# Patient Record
Sex: Female | Born: 1990 | Hispanic: Yes | Marital: Married | Smoking: Never smoker
Health system: Southern US, Community
[De-identification: ages and names within clinical notes are randomized; demographics above are authoritative.]

## PROBLEM LIST (undated history)

## (undated) DIAGNOSIS — K831 Obstruction of bile duct: Secondary | ICD-10-CM

## (undated) DIAGNOSIS — O26649 Intrahepatic cholestasis of pregnancy, unspecified trimester: Secondary | ICD-10-CM

## (undated) DIAGNOSIS — R053 Chronic cough: Secondary | ICD-10-CM

## (undated) DIAGNOSIS — O26619 Liver and biliary tract disorders in pregnancy, unspecified trimester: Secondary | ICD-10-CM

## (undated) DIAGNOSIS — O24419 Gestational diabetes mellitus in pregnancy, unspecified control: Secondary | ICD-10-CM

## (undated) HISTORY — DX: Intrahepatic cholestasis of pregnancy, unspecified trimester: O26.649

## (undated) HISTORY — PX: NO PAST SURGERIES: SHX2092

## (undated) HISTORY — DX: Obstruction of bile duct: K83.1

## (undated) HISTORY — DX: Liver and biliary tract disorders in pregnancy, unspecified trimester: O26.619

## (undated) HISTORY — DX: Chronic cough: R05.3

## (undated) HISTORY — DX: Gestational diabetes mellitus in pregnancy, unspecified control: O24.419

---

## 2016-10-25 ENCOUNTER — Encounter (HOSPITAL_COMMUNITY): Payer: Self-pay

## 2016-10-25 ENCOUNTER — Emergency Department (HOSPITAL_COMMUNITY)
Admission: EM | Admit: 2016-10-25 | Discharge: 2016-10-25 | Disposition: A | Discharge status: Home / Self Care | Payer: Medicaid Other | Attending: Emergency Medicine | Admitting: Emergency Medicine

## 2016-10-25 ENCOUNTER — Emergency Department (HOSPITAL_COMMUNITY): Payer: Medicaid Other

## 2016-10-25 DIAGNOSIS — O23592 Infection of other part of genital tract in pregnancy, second trimester: Secondary | ICD-10-CM | POA: Insufficient documentation

## 2016-10-25 DIAGNOSIS — R109 Unspecified abdominal pain: Secondary | ICD-10-CM

## 2016-10-25 DIAGNOSIS — N760 Acute vaginitis: Secondary | ICD-10-CM

## 2016-10-25 DIAGNOSIS — B9689 Other specified bacterial agents as the cause of diseases classified elsewhere: Secondary | ICD-10-CM | POA: Diagnosis not present

## 2016-10-25 DIAGNOSIS — R1084 Generalized abdominal pain: Secondary | ICD-10-CM | POA: Diagnosis not present

## 2016-10-25 DIAGNOSIS — O26892 Other specified pregnancy related conditions, second trimester: Secondary | ICD-10-CM | POA: Diagnosis present

## 2016-10-25 DIAGNOSIS — Z3A15 15 weeks gestation of pregnancy: Secondary | ICD-10-CM

## 2016-10-25 LAB — I-STAT BETA HCG BLOOD, ED (MC, WL, AP ONLY): I-stat hCG, quantitative: >2000 m[IU]/mL — ABNORMAL HIGH (ref <5)

## 2016-10-25 LAB — I-STAT CHEM 8, ED
BUN: <3 mg/dL — ABNORMAL LOW (ref 6–20)
CREATININE: 0.5 mg/dL (ref 0.44–1.00)
Calcium, Ion: 1.14 mmol/L — ABNORMAL LOW (ref 1.15–1.40)
Chloride: 105 mmol/L (ref 101–111)
GLUCOSE: 80 mg/dL (ref 65–99)
HCT: 34 % — ABNORMAL LOW (ref 36.0–46.0)
HEMOGLOBIN: 11.6 g/dL — AB (ref 12.0–15.0)
POTASSIUM: 3.5 mmol/L (ref 3.5–5.1)
Sodium: 139 mmol/L (ref 135–145)
TCO2: 23 mmol/L (ref 0–100)

## 2016-10-25 LAB — WET PREP, GENITAL
SPERM: NONE SEEN
Trich, Wet Prep: NONE SEEN
Yeast Wet Prep HPF POC: NONE SEEN

## 2016-10-25 MED ORDER — METRONIDAZOLE 500 MG PO TABS: 500.0000 mg | ORAL_TABLET | Freq: Two times a day (BID) | ORAL | 0 refills | Status: DC

## 2016-10-25 NOTE — ED Notes (Signed)
On way to US 

## 2016-10-25 NOTE — ED Provider Notes (Signed)
[facility]-EM[facility]ENCY DEPT Provider Note   CSN: [id number] Arrival date & time: 10/25/16  1103     History   Chief Complaint Chief Complaint  Patient presents with  . Abdominal Pain    HPI Wendy Moses is a 26 y.o. female.  She presents for evaluation of suprapubic pain associated with generalized abdominal pain. Onset of pain 2 days ago. She thinks she is [redacted] weeks pregnant, so based on last menstrual period 07/13/2016. She denies fever, chills, nausea, vomiting, vaginal bleeding or vaginal discharge. She has not had prenatal care yet. This is her second pregnancy. There are no other known modifying factors.  HPI  History reviewed. No pertinent past medical history.  There are no active problems to display for this patient.   History reviewed. No pertinent surgical history.  OB History    Gravida Para Term Preterm AB Living   1             SAB TAB Ectopic Multiple Live Births                   Home Medications    Prior to Admission medications   Medication Sig Start Date End Date Taking? Authorizing Provider  Prenatal Vit-Fe Fumarate-FA (MULTIVITAMIN-PRENATAL) 27-0.8 MG TABS tablet Take 1 tablet by mouth daily at 12 noon.   Yes Historical Provider, MD  metroNIDAZOLE (FLAGYL) 500 MG tablet Take 1 tablet (500 mg total) by mouth 2 (two) times daily. 10/25/16   Mancel Bale, MD    Family History History reviewed. No pertinent family history.  Social History Social History  Substance Use Topics  . Smoking status: Never Smoker  . Smokeless tobacco: Never Used  . Alcohol use Not on file     Allergies   Patient has no known allergies.   Review of Systems Review of Systems  All other systems reviewed and are negative.    Physical Exam Updated Vital Signs BP 110/71   Pulse 82   Temp 98.8 F (37.1 C) (Oral)   Resp 18   Ht 5\' 4"  (1.626 m)   Wt 170 lb (77.1 kg)   LMP 07/13/2016 (Exact Date)   SpO2 97%   BMI 29.18 kg/m   Physical Exam  Constitutional:  She is oriented to person, place, and time. She appears well-developed and well-nourished. No distress.  HENT:  Head: Normocephalic and atraumatic.  Eyes: Conjunctivae and EOM are normal. Pupils are equal, round, and reactive to light.  Neck: Normal range of motion and phonation normal. Neck supple.  Cardiovascular: Normal rate and regular rhythm.   Pulmonary/Chest: Effort normal and breath sounds normal. She exhibits no tenderness.  Abdominal: Soft. She exhibits no distension. There is no tenderness. There is no guarding.  Genitourinary:  Genitourinary Comments: Normal external female genitalia. Thick green discharge in the vaginal vault. No cervical bleeding. Her cervical dilation. On bimanual examination the uterus is palpable, 10-12 weeks size. No significant uterine tenderness.  Musculoskeletal: Normal range of motion.  Neurological: She is alert and oriented to person, place, and time. She exhibits normal muscle tone.  Skin: Skin is warm and dry.  Psychiatric: She has a normal mood and affect. Her behavior is normal. Judgment and thought content normal.  Nursing note and vitals reviewed.    ED Treatments / Results  Labs (all labs ordered are listed, but only abnormal results are displayed) Labs Reviewed  WET PREP, GENITAL - Abnormal; Notable for the following:       Result Value  Clue Cells Wet Prep HPF POC PRESENT (*)    WBC, Wet Prep HPF POC MANY (*)    All other components within normal limits  I-STAT CHEM 8, ED - Abnormal; Notable for the following:    BUN <3 (*)    Calcium, Ion 1.14 (*)    Hemoglobin 11.6 (*)    HCT 34.0 (*)    All other components within normal limits  I-STAT BETA HCG BLOOD, ED ([facility], [facility], [facility] ONLY) - Abnormal; Notable for the following:    I-stat hCG, quantitative >2,000.0 (*)    All other components within normal limits  RPR  HIV ANTIBODY (ROUTINE TESTING)  GC/CHLAMYDIA PROBE AMP (Angelica) NOT AT [facility]    EKG  EKG Interpretation None        Radiology US Ob Limited  Result Date: 10/25/2016 CLINICAL DATA:  Abdominal pain.  Pregnancy. EXAM: LIMITED OBSTETRIC ULTRASOUND FINDINGS: Number of Fetuses: 1 Heart Rate:  143 bpm Movement: Present Presentation: Transverse Placental Location: Fundal Previa: No Amniotic Fluid (Subjective):  Within normal limits. BPD:  3.0cm 15w  4d MATERNAL FINDINGS: Cervix:  Appears closed. Uterus/Adnexae: No abnormality visualized. Left ovary not visualized. IMPRESSION: Single viable intrauterine pregnancy at 15 weeks 4 days. This exam is performed on an emergent basis and does not comprehensively evaluate fetal size, dating, or anatomy; follow-up complete OB US should be considered if further fetal assessment is warranted. Electronically Signed   By: Maisie Fus  Register   On: 10/25/2016 15:30    Procedures Procedures (including critical care time)  Medications Ordered in ED Medications - No data to display   Initial Impression / Assessment and Plan / ED Course  I have reviewed the triage vital signs and the nursing notes.  Pertinent labs & imaging results that were available during my care of the patient were reviewed by me and considered in my medical decision making (see chart for details).     Medications - No data to display  Patient Vitals for the past 24 hrs:  BP Temp Temp src Pulse Resp SpO2 Height Weight  10/25/16 1245 110/71 - - 82 - 97 % - -  10/25/16 1200 102/69 - - 80 - 97 % - -  10/25/16 1113 - - - - - - 5\' 4"  (1.626 m) 170 lb (77.1 kg)  10/25/16 1111 106/77 98.8 F (37.1 C) Oral 93 18 96 % - -    3:40 PM Reevaluation with update and discussion. After initial assessment and treatment, an updated evaluation reveals she is comfortable.  She has no further complaints.  Findings discussed with the patient and all questions were answered. Ellouise Mcwhirter L    Final Clinical Impressions(s) / ED Diagnoses   Final diagnoses:  [redacted] weeks gestation of pregnancy  Acute vaginitis     Nonspecific abdominal pain in the early second trimester pregnancy.  Reassuring ultrasound today.  Vaginal discharge with signs for nonspecific vaginitis.  Doubt serious bacterial infection acute pregnancy complication or metabolic instability.  Nursing Notes Reviewed/ Care Coordinated Applicable Imaging Reviewed Interpretation of Laboratory Data incorporated into ED treatment  The patient appears reasonably screened and/or stabilized for discharge and I doubt any other medical condition or other [facility] requiring further screening, evaluation, or treatment in the ED at this time prior to discharge.  Plan: Home Medications-continue usual; Home Treatments-rest, fluids F-2; return here if the recommended treatment, does not improve the symptoms; Recommended follow up-OB follow-up as soon as possible   New Prescriptions New Prescriptions   METRONIDAZOLE (  FLAGYL) 500 MG TABLET    Take 1 tablet (500 mg total) by mouth 2 (two) times daily.     Mancel BaleElliott Filmore Molyneux, MD 10/25/16 [id number]

## 2016-10-25 NOTE — ED Triage Notes (Signed)
Pt presents with vaginal pressure that began yesterday.  Pt reports pressure begins in vagina and radiates up into epigastric area.  Pt denies any vaginal discharge/bleeding or dysuria, pt denies any nausea, vomiting.

## 2016-10-25 NOTE — Discharge Instructions (Signed)
Get plenty of rest and drink a lot of fluids.  Follow up with your obstetric doctor as soon as possible.  It is safe to take Tylenol, for pain.

## 2016-10-26 LAB — GC/CHLAMYDIA PROBE AMP (~~LOC~~) NOT AT ARMC
CHLAMYDIA, DNA PROBE: NEGATIVE
NEISSERIA GONORRHEA: NEGATIVE

## 2016-10-26 LAB — RPR: RPR Ser Ql: NONREACTIVE

## 2016-10-26 LAB — HIV ANTIBODY (ROUTINE TESTING W REFLEX): HIV SCREEN 4TH GENERATION: NONREACTIVE

## 2017-09-10 NOTE — L&D Delivery Note (Signed)
Delivery Note  At  1810 a viable female was delivered via  (Presentation: ; LOA ).  APGAR: , ;pending weight  .  pending Placenta status: sent to pathology due to cholestasis, .  Cord:3 vessels  with the following complications: Heavy bleeding cytotec 800mcg given per rectum. Bleeding stabilized  Anesthesia:  Epidural Episiotomy:   Lacerations:  first degree lac Suture Repair: 3.0 vicryl Est. Blood Loss (mL):  450cc  Mom to postpartum.  Baby to Couplet care / Skin to Skin.  Bristol Osentoski A Lillyonna Armstead CNM 04/20/2018, 6:32 PM

## 2018-04-09 ENCOUNTER — Telehealth (HOSPITAL_COMMUNITY): Payer: Self-pay | Admitting: *Deleted

## 2018-04-09 ENCOUNTER — Encounter (HOSPITAL_COMMUNITY): Payer: Self-pay | Admitting: *Deleted

## 2018-04-09 NOTE — Telephone Encounter (Signed)
Preadmission screen INTERPRETER NUMBER [phone]

## 2018-04-16 ENCOUNTER — Other Ambulatory Visit: Payer: Self-pay | Admitting: Obstetrics and Gynecology

## 2018-04-20 ENCOUNTER — Inpatient Hospital Stay (HOSPITAL_COMMUNITY): Payer: Medicaid Other | Admitting: Anesthesiology

## 2018-04-20 ENCOUNTER — Inpatient Hospital Stay (HOSPITAL_COMMUNITY)
Admission: RE | Admit: 2018-04-20 | Discharge: 2018-04-22 | DRG: 805 | Disposition: A | Discharge status: Home / Self Care | Payer: Medicaid Other | Attending: Obstetrics and Gynecology | Admitting: Obstetrics and Gynecology

## 2018-04-20 ENCOUNTER — Encounter (HOSPITAL_COMMUNITY): Payer: Self-pay

## 2018-04-20 DIAGNOSIS — F419 Anxiety disorder, unspecified: Secondary | ICD-10-CM | POA: Diagnosis present

## 2018-04-20 DIAGNOSIS — O26649 Intrahepatic cholestasis of pregnancy, unspecified trimester: Secondary | ICD-10-CM | POA: Diagnosis present

## 2018-04-20 DIAGNOSIS — R05 Cough: Secondary | ICD-10-CM | POA: Diagnosis present

## 2018-04-20 DIAGNOSIS — O9902 Anemia complicating childbirth: Secondary | ICD-10-CM | POA: Diagnosis present

## 2018-04-20 DIAGNOSIS — O99344 Other mental disorders complicating childbirth: Secondary | ICD-10-CM | POA: Diagnosis present

## 2018-04-20 DIAGNOSIS — K831 Obstruction of bile duct: Secondary | ICD-10-CM | POA: Diagnosis present

## 2018-04-20 DIAGNOSIS — O99214 Obesity complicating childbirth: Secondary | ICD-10-CM | POA: Diagnosis present

## 2018-04-20 DIAGNOSIS — O26619 Liver and biliary tract disorders in pregnancy, unspecified trimester: Secondary | ICD-10-CM

## 2018-04-20 DIAGNOSIS — Z3490 Encounter for supervision of normal pregnancy, unspecified, unspecified trimester: Secondary | ICD-10-CM | POA: Diagnosis present

## 2018-04-20 DIAGNOSIS — Z3A37 37 weeks gestation of pregnancy: Secondary | ICD-10-CM

## 2018-04-20 DIAGNOSIS — O864 Pyrexia of unknown origin following delivery: Secondary | ICD-10-CM | POA: Diagnosis not present

## 2018-04-20 DIAGNOSIS — O99019 Anemia complicating pregnancy, unspecified trimester: Secondary | ICD-10-CM | POA: Diagnosis present

## 2018-04-20 DIAGNOSIS — O2662 Liver and biliary tract disorders in childbirth: Principal | ICD-10-CM | POA: Diagnosis present

## 2018-04-20 DIAGNOSIS — O9089 Other complications of the puerperium, not elsewhere classified: Secondary | ICD-10-CM | POA: Diagnosis present

## 2018-04-20 DIAGNOSIS — E669 Obesity, unspecified: Secondary | ICD-10-CM | POA: Diagnosis present

## 2018-04-20 DIAGNOSIS — D649 Anemia, unspecified: Secondary | ICD-10-CM | POA: Diagnosis present

## 2018-04-20 HISTORY — DX: Intrahepatic cholestasis of pregnancy, unspecified trimester: O26.649

## 2018-04-20 HISTORY — DX: Encounter for supervision of normal pregnancy, unspecified, unspecified trimester: Z34.90

## 2018-04-20 LAB — RPR: RPR Ser Ql: NONREACTIVE

## 2018-04-20 LAB — HEPATIC FUNCTION PANEL
ALT: 19 U/L (ref 0–44)
AST: 21 U/L (ref 15–41)
Albumin: 2.5 g/dL — ABNORMAL LOW (ref 3.5–5.0)
Alkaline Phosphatase: 274 U/L — ABNORMAL HIGH (ref 38–126)
TOTAL PROTEIN: 6.9 g/dL (ref 6.5–8.1)
Total Bilirubin: 0.6 mg/dL (ref 0.3–1.2)

## 2018-04-20 LAB — TYPE AND SCREEN
ABO/RH(D): A POS
ANTIBODY SCREEN: NEGATIVE

## 2018-04-20 LAB — CBC
HEMATOCRIT: 29 % — AB (ref 36.0–46.0)
HEMOGLOBIN: 9 g/dL — AB (ref 12.0–15.0)
MCH: 22 pg — ABNORMAL LOW (ref 26.0–34.0)
MCHC: 31 g/dL (ref 30.0–36.0)
MCV: 70.7 fL — ABNORMAL LOW (ref 78.0–100.0)
Platelets: 417 10*3/uL — ABNORMAL HIGH (ref 150–400)
RBC: 4.1 MIL/uL (ref 3.87–5.11)
RDW: 17.7 % — ABNORMAL HIGH (ref 11.5–15.5)
WBC: 8.8 10*3/uL (ref 4.0–10.5)

## 2018-04-20 LAB — ABO/RH: ABO/RH(D): A POS

## 2018-04-20 MED ORDER — LACTATED RINGERS IV SOLN
500.0000 mL | Freq: Once | INTRAVENOUS | Status: AC
  Administered 2018-04-20: 500 mL via INTRAVENOUS

## 2018-04-20 MED ORDER — FENTANYL CITRATE (PF) 100 MCG/2ML IJ SOLN: 100.0000 ug | INTRAMUSCULAR | Status: DC | PRN

## 2018-04-20 MED ORDER — TERBUTALINE SULFATE 1 MG/ML IJ SOLN: 0.2500 mg | Freq: Once | INTRAMUSCULAR | Status: DC | PRN

## 2018-04-20 MED ORDER — OXYCODONE-ACETAMINOPHEN 5-325 MG PO TABS
1.0000 | ORAL_TABLET | ORAL | Status: DC | PRN
Start: 2018-04-20 — End: 2018-04-22

## 2018-04-20 MED ORDER — SOD CITRATE-CITRIC ACID 500-334 MG/5ML PO SOLN: 30.0000 mL | ORAL | Status: DC | PRN

## 2018-04-20 MED ORDER — ACETAMINOPHEN 325 MG PO TABS
650.0000 mg | ORAL_TABLET | ORAL | Status: DC | PRN
  Administered 2018-04-20: 650 mg via ORAL
  Filled 2018-04-20: qty 2

## 2018-04-20 MED ORDER — FENTANYL 2.5 MCG/ML BUPIVACAINE 1/10 % EPIDURAL INFUSION (WH - ANES)
14.0000 mL/h | INTRAMUSCULAR | Status: DC | PRN
  Administered 2018-04-20: 14 mL/h via EPIDURAL
  Filled 2018-04-20: qty 100

## 2018-04-20 MED ORDER — COCONUT OIL OIL: 1.0000 "application " | TOPICAL_OIL | Status: DC | PRN

## 2018-04-20 MED ORDER — DIPHENHYDRAMINE HCL 25 MG PO CAPS: 25.0000 mg | ORAL_CAPSULE | Freq: Four times a day (QID) | ORAL | Status: DC | PRN

## 2018-04-20 MED ORDER — DIBUCAINE 1 % RE OINT: 1.0000 "application " | TOPICAL_OINTMENT | RECTAL | Status: DC | PRN

## 2018-04-20 MED ORDER — LACTATED RINGERS IV SOLN
INTRAVENOUS | Status: DC
  Administered 2018-04-20 (×2): via INTRAVENOUS

## 2018-04-20 MED ORDER — BENZOCAINE-MENTHOL 20-0.5 % EX AERO
1.0000 "application " | INHALATION_SPRAY | CUTANEOUS | Status: DC | PRN
  Administered 2018-04-20: 1 via TOPICAL
  Filled 2018-04-20: qty 56

## 2018-04-20 MED ORDER — SENNOSIDES-DOCUSATE SODIUM 8.6-50 MG PO TABS
2.0000 | ORAL_TABLET | ORAL | Status: DC
  Administered 2018-04-21 (×2): 2 via ORAL
  Filled 2018-04-20 (×2): qty 2

## 2018-04-20 MED ORDER — MISOPROSTOL 200 MCG PO TABS
ORAL_TABLET | ORAL | Status: AC
  Filled 2018-04-20: qty 4

## 2018-04-20 MED ORDER — URSODIOL 300 MG PO CAPS
300.0000 mg | ORAL_CAPSULE | Freq: Two times a day (BID) | ORAL | Status: DC
  Administered 2018-04-21 – 2018-04-22 (×3): 300 mg via ORAL
  Filled 2018-04-20 (×6): qty 1

## 2018-04-20 MED ORDER — SIMETHICONE 80 MG PO CHEW: 80.0000 mg | CHEWABLE_TABLET | ORAL | Status: DC | PRN

## 2018-04-20 MED ORDER — PHENYLEPHRINE 40 MCG/ML (10ML) SYRINGE FOR IV PUSH (FOR BLOOD PRESSURE SUPPORT)
80.0000 ug | PREFILLED_SYRINGE | INTRAVENOUS | Status: DC | PRN
  Filled 2018-04-20: qty 5

## 2018-04-20 MED ORDER — DIPHENHYDRAMINE HCL 50 MG/ML IJ SOLN: 12.5000 mg | INTRAMUSCULAR | Status: DC | PRN

## 2018-04-20 MED ORDER — HYDROXYZINE HCL 25 MG PO TABS
25.0000 mg | ORAL_TABLET | Freq: Three times a day (TID) | ORAL | Status: DC | PRN
  Filled 2018-04-20: qty 1

## 2018-04-20 MED ORDER — LACTATED RINGERS IV SOLN: 500.0000 mL | Freq: Once | INTRAVENOUS | Status: DC

## 2018-04-20 MED ORDER — LACTATED RINGERS IV SOLN: 500.0000 mL | INTRAVENOUS | Status: DC | PRN

## 2018-04-20 MED ORDER — ONDANSETRON HCL 4 MG/2ML IJ SOLN: 4.0000 mg | INTRAMUSCULAR | Status: DC | PRN

## 2018-04-20 MED ORDER — OXYTOCIN 40 UNITS IN LACTATED RINGERS INFUSION - SIMPLE MED: 1.0000 m[IU]/min | INTRAVENOUS | Status: DC

## 2018-04-20 MED ORDER — OXYTOCIN 40 UNITS IN LACTATED RINGERS INFUSION - SIMPLE MED
2.5000 [IU]/h | INTRAVENOUS | Status: DC
  Administered 2018-04-20: 2.5 [IU]/h via INTRAVENOUS

## 2018-04-20 MED ORDER — LIDOCAINE HCL (PF) 1 % IJ SOLN
INTRAMUSCULAR | Status: DC | PRN
  Administered 2018-04-20 (×2): 4 mL via EPIDURAL

## 2018-04-20 MED ORDER — GUAIFENESIN 100 MG/5ML PO SOLN
5.0000 mL | ORAL | Status: DC | PRN
  Administered 2018-04-20 – 2018-04-22 (×2): 100 mg via ORAL
  Filled 2018-04-20 (×2): qty 15

## 2018-04-20 MED ORDER — ZOLPIDEM TARTRATE 5 MG PO TABS: 5.0000 mg | ORAL_TABLET | Freq: Every evening | ORAL | Status: DC | PRN

## 2018-04-20 MED ORDER — TETANUS-DIPHTH-ACELL PERTUSSIS 5-2.5-18.5 LF-MCG/0.5 IM SUSP: 0.5000 mL | Freq: Once | INTRAMUSCULAR | Status: DC

## 2018-04-20 MED ORDER — ACETAMINOPHEN 325 MG PO TABS: 650.0000 mg | ORAL_TABLET | ORAL | Status: DC | PRN

## 2018-04-20 MED ORDER — OXYCODONE-ACETAMINOPHEN 5-325 MG PO TABS: 2.0000 | ORAL_TABLET | ORAL | Status: DC | PRN

## 2018-04-20 MED ORDER — WITCH HAZEL-GLYCERIN EX PADS: 1.0000 "application " | MEDICATED_PAD | CUTANEOUS | Status: DC | PRN

## 2018-04-20 MED ORDER — ONDANSETRON HCL 4 MG PO TABS: 4.0000 mg | ORAL_TABLET | ORAL | Status: DC | PRN

## 2018-04-20 MED ORDER — ONDANSETRON HCL 4 MG/2ML IJ SOLN: 4.0000 mg | Freq: Four times a day (QID) | INTRAMUSCULAR | Status: DC | PRN

## 2018-04-20 MED ORDER — EPHEDRINE 5 MG/ML INJ
10.0000 mg | INTRAVENOUS | Status: DC | PRN
  Filled 2018-04-20: qty 2

## 2018-04-20 MED ORDER — LIDOCAINE HCL (PF) 1 % IJ SOLN
30.0000 mL | INTRAMUSCULAR | Status: DC | PRN
  Filled 2018-04-20: qty 30

## 2018-04-20 MED ORDER — PRENATAL MULTIVITAMIN CH
1.0000 | ORAL_TABLET | Freq: Every day | ORAL | Status: DC
  Administered 2018-04-21 – 2018-04-22 (×2): 1 via ORAL
  Filled 2018-04-20 (×2): qty 1

## 2018-04-20 MED ORDER — IBUPROFEN 600 MG PO TABS
600.0000 mg | ORAL_TABLET | Freq: Four times a day (QID) | ORAL | Status: DC
  Administered 2018-04-20 – 2018-04-22 (×7): 600 mg via ORAL
  Filled 2018-04-20 (×7): qty 1

## 2018-04-20 MED ORDER — OXYTOCIN 40 UNITS IN LACTATED RINGERS INFUSION - SIMPLE MED
1.0000 m[IU]/min | INTRAVENOUS | Status: DC
  Administered 2018-04-20: 2 m[IU]/min via INTRAVENOUS
  Filled 2018-04-20: qty 1000

## 2018-04-20 MED ORDER — OXYTOCIN BOLUS FROM INFUSION
500.0000 mL | Freq: Once | INTRAVENOUS | Status: AC
  Administered 2018-04-20: 500 mL via INTRAVENOUS

## 2018-04-20 MED ORDER — LACTATED RINGERS IV SOLN
INTRAVENOUS | Status: DC
  Administered 2018-04-20: 22:00:00 via INTRAVENOUS

## 2018-04-20 MED ORDER — PHENYLEPHRINE 40 MCG/ML (10ML) SYRINGE FOR IV PUSH (FOR BLOOD PRESSURE SUPPORT)
80.0000 ug | PREFILLED_SYRINGE | INTRAVENOUS | Status: DC | PRN
  Filled 2018-04-20: qty 10
  Filled 2018-04-20: qty 5

## 2018-04-20 MED ORDER — MISOPROSTOL 25 MCG QUARTER TABLET
25.0000 ug | ORAL_TABLET | ORAL | Status: DC | PRN
  Administered 2018-04-20: 800 ug via VAGINAL
  Administered 2018-04-20 (×2): 25 ug via VAGINAL
  Filled 2018-04-20 (×3): qty 1

## 2018-04-20 MED ORDER — TERBUTALINE SULFATE 1 MG/ML IJ SOLN
0.2500 mg | Freq: Once | INTRAMUSCULAR | Status: DC | PRN
  Filled 2018-04-20: qty 1

## 2018-04-20 NOTE — Anesthesia Procedure Notes (Signed)
Epidural Patient location during procedure: OB Start time: 04/20/2018 11:02 AM  Staffing Anesthesiologist: Mal AmabileFoster, Cohl Behrens, MD Performed: anesthesiologist   Preanesthetic Checklist Completed: patient identified, site marked, surgical consent, pre-op evaluation, timeout performed, IV checked, risks and benefits discussed and monitors and equipment checked  Epidural Patient position: sitting Prep: site prepped and draped and DuraPrep Patient monitoring: continuous pulse ox and blood pressure Approach: midline Location: L3-L4 Injection technique: LOR air  Needle:  Needle type: Tuohy  Needle gauge: 17 G Needle length: 9 cm and 9 Needle insertion depth: 4 cm Catheter type: closed end flexible Catheter size: 19 Gauge Catheter at skin depth: 9 cm Test dose: negative and Other  Assessment Events: blood not aspirated, injection not painful, no injection resistance, negative IV test and no paresthesia  Additional Notes Patient identified. Risks and benefits discussed including failed block, incomplete  Pain control, post dural puncture headache, nerve damage, paralysis, blood pressure Changes, nausea, vomiting, reactions to medications-both toxic and allergic and post Partum back pain. All questions were answered. Patient expressed understanding and wished to proceed. Sterile technique was used throughout procedure. Epidural site was Dressed with sterile barrier dressing. No paresthesias, signs of intravascular injection Or signs of intrathecal spread were encountered.  Patient was more comfortable after the epidural was dosed. Please see RN's note for documentation of vital signs and FHR which are stable.

## 2018-04-20 NOTE — Progress Notes (Signed)
Per Kathalene FramesEllis Greer, CNM GBS is negative. RN unable to view in prenatal loaded in Epic but [place], [place] can view in "[place]".  Luna FuseHazelwood, Nixon Kolton N

## 2018-04-20 NOTE — Progress Notes (Signed)
PPD # 0  CHIEF COMPLAINT:  No chief complaint on file.   Subjective  Rn called and states pt had temp of 100.4 and then 100.8 an hour later . She has had ibuprofen and tylenol. C/O a cough.     Objective  Results for orders placed or performed during the hospital encounter of 04/20/18 (from the past 24 hour(s))  Hepatic function panel     Status: Abnormal   Collection Time: 04/20/18  1:45 AM  Result Value Ref Range   Total Protein 6.9 6.5 - 8.1 g/dL   Albumin 2.5 (L) 3.5 - 5.0 g/dL   AST 21 15 - 41 U/L   ALT 19 0 - 44 U/L   Alkaline Phosphatase 274 (H) 38 - 126 U/L   Total Bilirubin 0.6 0.3 - 1.2 mg/dL   Bilirubin, Direct <7.8<0.1 0.0 - 0.2 mg/dL   Indirect Bilirubin NOT CALCULATED 0.3 - 0.9 mg/dL  CBC     Status: Abnormal   Collection Time: 04/20/18  1:45 AM  Result Value Ref Range   WBC 8.8 4.0 - 10.5 K/uL   RBC 4.10 3.87 - 5.11 MIL/uL   Hemoglobin 9.0 (L) 12.0 - 15.0 g/dL   HCT 29.529.0 (L) 62.136.0 - 30.846.0 %   MCV 70.7 (L) 78.0 - 100.0 fL   MCH 22.0 (L) 26.0 - 34.0 pg   MCHC 31.0 30.0 - 36.0 g/dL   RDW 65.717.7 (H) 84.611.5 - 96.215.5 %   Platelets 417 (H) 150 - 400 K/uL  Type and screen [facility]     Status: None   Collection Time: 04/20/18  1:45 AM  Result Value Ref Range   ABO/RH(D) A POS    Antibody Screen NEG    Sample Expiration      04/23/2018 Performed at [facility], [address] [place], [place] [address]   RPR     Status: None   Collection Time: 04/20/18  1:45 AM  Result Value Ref Range   RPR Ser Ql Non Reactive Non Reactive  ABO/Rh     Status: None   Collection Time: 04/20/18  1:45 AM  Result Value Ref Range   ABO/RH(D)      A POS Performed at [facility], [address] [place], [place] [address]    Vitals:   04/20/18 2020 04/20/18 2123  BP: 126/79 117/75  Pulse: 89 87  Resp: 18 18  Temp: (!) 100.4 F (38 C) (!) 100.8 F (38.2 C)  SpO2: 99% 99%    VITALS:  height is 5\' 4"  (1.626 m) and weight is 88.3 kg. Her temperature is  100.8 F (38.2 C) (abnormal). Her blood pressure is 117/75 and her pulse is 87. Her respiration is 18 and oxygen saturation is 99%.   I personally reviewed Labs under Results section.  Radiology Reports     Assessment/Plan:  Postpartum Fever SVD Retake temp in 1 hour Restart IVF Lr@ 125cc/hr Robitussin for cough    LOS: 0 days   Lawson FiscalLori A Clemmons

## 2018-04-20 NOTE — Progress Notes (Signed)
LABOR PROGRESS NOTE  Wendy RidgeJessica Kirschenbaum is a 27 y.o. G3P2002 at 7675w0d  admitted for induction for cholestasis  Subjective: Just got epidural. Still not comfortable. AROM clear fluid without difficulty  Objective: BP 113/68   Pulse 84   Temp 97.8 F (36.6 C) (Oral)   Resp 18   Ht 5\' 4"  (1.626 m)   Wt 88.3 kg   BMI 33.40 kg/m  or  Vitals:   04/20/18 1120 04/20/18 1125 04/20/18 1130 04/20/18 1135  BP: 131/68 115/69 115/62 113/68  Pulse: 99 85 87 84  Resp: 18 18 18 18   Temp:      TempSrc:      Weight:      Height:         Dilation: 2.5 Effacement (%): 50 Station: -2 Presentation: Vertex Exam by:: Illene BolusLori Clemmons, CNM  Labs: Lab Results  Component Value Date   WBC 8.8 04/20/2018   HGB 9.0 (L) 04/20/2018   HCT 29.0 (L) 04/20/2018   MCV 70.7 (L) 04/20/2018   PLT 417 (H) 04/20/2018    Patient Active Problem List   Diagnosis Date Noted  . Intrahepatic cholestasis of pregnancy 04/20/2018  . Encounter for induction of labor 04/20/2018    Assessment / Plan:   Labor: Early Fetal Wellbeing:  Cat 1 Pain Control:  Epidural Start Pitocin Anticipated MOD:  SVD  Lori A Clemmons CNM 04/20/2018, 11:41 AM

## 2018-04-20 NOTE — H&P (Signed)
Wendy RidgeJessica Moses is a 27 y.o. female presenting for induction of labor for intrahepatic cholestasis of pregnancy.  OB History    Gravida  5   Para  2   Term      Preterm      AB      Living  2     SAB      TAB      Ectopic      Multiple      Live Births  2          Past Medical History:  Diagnosis Date  . Cholestasis during pregnancy   . Gestational diabetes    first pregnancy   History reviewed. No pertinent surgical history. Family History: family history includes Diabetes in her mother. Social History:  reports that she has never smoked. She has never used smokeless tobacco. She reports that she does not drink alcohol or use drugs.     Maternal Diabetes: No Genetic Screening: Normal Maternal Ultrasounds/Referrals: Normal Fetal Ultrasounds or other Referrals:  None Maternal Substance Abuse:  No Significant Maternal Medications:  Meds include: Other: Ursodiol Significant Maternal Lab Results:  Lab values include: Other: elevated bile acids Other Comments:  None  Review of Systems  All other systems reviewed and are negative.  Maternal Medical History:  Fetal activity: Perceived fetal activity is normal.    Prenatal Complications - Diabetes: none.    Dilation: 3 Effacement (%): 50 Station: -2 Exam by:: Wendy Moses, CNM  Vitals:   04/20/18 0030 04/20/18 0100  BP:  115/67  Pulse:  88  Resp:  16  Temp:  98.4 F (36.9 C)  TempSrc:  Oral  Weight: 88.3 kg    No results found for this or any previous visit (from the past 24 hour(s)).  Maternal Exam:  Uterine Assessment: Contraction frequency is irregular.   Abdomen: Patient reports no abdominal tenderness. Fundal height is Size=dates.   Estimated fetal weight is 8lbs9oz.   Fetal presentation: vertex  Introitus: Normal vulva. Normal vagina.  Pelvis: adequate for delivery.   Cervix: Cervix evaluated by digital exam.     Fetal Exam Fetal Monitor Review: Mode: ultrasound.   Baseline rate: 140.   Variability: moderate (6-25 bpm).   Pattern: accelerations present and no decelerations.    Fetal State Assessment: Category I - tracings are normal.     Physical Exam  Prenatal labs: ABO, Rh:  A+ Antibody:  Negative Rubella:  Immune RPR:   NR HBsAg:   NR HIV:   NR GBS:   Negative  Assessment/Plan: 27 y.o. G3P2 at 37 weeks Category 1 FHTs Induction of labor for intrahepatic cholestasis of pregnancy controlled with Urosdiol Patient having irregular contractions she cannot feel  Admit to L&D Cytotec Bile acids and LFTs drawn  Wendy Moses 04/20/2018, 1:47 AM

## 2018-04-20 NOTE — Anesthesia Preprocedure Evaluation (Signed)
Anesthesia Evaluation  Patient identified by MRN, date of birth, ID band Patient awake    Reviewed: Allergy & Precautions, Patient's Chart, lab work & pertinent test results  Airway Mallampati: II  TM Distance: >3 FB Neck ROM: Full    Dental  (+) Teeth Intact   Pulmonary neg pulmonary ROS,    Pulmonary exam normal breath sounds clear to auscultation       Cardiovascular negative cardio ROS Normal cardiovascular exam Rhythm:Regular Rate:Normal     Neuro/Psych negative neurological ROS  negative psych ROS   GI/Hepatic Neg liver ROS, GERD  ,  Endo/Other  diabetes, Well Controlled, GestationalObesity  Renal/GU negative Renal ROS  negative genitourinary   Musculoskeletal negative musculoskeletal ROS (+)   Abdominal (+) + obese,   Peds  Hematology negative hematology ROS (+)   Anesthesia Other Findings   Reproductive/Obstetrics (+) Pregnancy                             Anesthesia Physical Anesthesia Plan  ASA: II  Anesthesia Plan: Epidural   Post-op Pain Management:    Induction:   PONV Risk Score and Plan:   Airway Management Planned: Natural Airway  Additional Equipment:   Intra-op Plan:   Post-operative Plan:   Informed Consent: I have reviewed the patients History and Physical, chart, labs and discussed the procedure including the risks, benefits and alternatives for the proposed anesthesia with the patient or authorized representative who has indicated his/her understanding and acceptance.     Plan Discussed with: Anesthesiologist  Anesthesia Plan Comments:         Anesthesia Quick Evaluation

## 2018-04-20 NOTE — Lactation Note (Signed)
This note was copied from a baby's chart. Lactation Consultation Note  Patient Name: Wendy Ebbie RidgeJessica Moder ZOXWR'UToday's Date: 04/20/2018 Reason for consult: Initial assessment;Early term 37-38.6wks  P3 mother whose infant is now 393 hours old. This baby is an ETI at 37 weeks.  Mother has 2 other children who she breastfed and does not feel like she needs any help at this time.  I encouraged feeding 8-12 times/24 hours or sooner if baby shows cues, reminding her to awaken baby every three hours since she is an ETI. Reviewed feeding cues with mother.  She also stated that she knows about hand expression and has already tried expressing colostrum.  Colostrum container provided for any EBM she may obtain.  Demonstrated how mother can take colostrum drops and finger feed baby any drops she obtains.  Also asked her to call RN/LC for latch assistance as needed.  Mom made aware of O/P services, breastfeeding support groups, community resources, and our phone # for post-discharge questions.  Mother has visitors in room now.   Maternal Data Formula Feeding for Exclusion: No Has patient been taught Hand Expression?: Yes Does the patient have breastfeeding experience prior to this delivery?: Yes  Feeding Feeding Type: Breast Fed  LATCH Score Latch: Repeated attempts needed to sustain latch, nipple held in mouth throughout feeding, stimulation needed to elicit sucking reflex.  Audible Swallowing: None  Type of Nipple: Everted at rest and after stimulation  Comfort (Breast/Nipple): Soft / non-tender  Hold (Positioning): No assistance needed to correctly position infant at breast.  LATCH Score: 7  Interventions Interventions: Adjust position;Support pillows  Lactation Tools Discussed/Used     Consult Status Consult Status: Follow-up Date: 04/21/18 Follow-up type: In-patient    Dora SimsBeth R Elfego Giammarino 04/20/2018, 9:49 PM

## 2018-04-20 NOTE — Progress Notes (Signed)
Ebbie RidgeJessica Marshburn is a 27 y.o. G3P2002 at 5337w0dadmitted for induction of labor due to intrahepatic cholestasis of pregnancy.  Subjective: Patient able to feel contractions now but they are mild.    Objective: Vitals:   04/20/18 0303 04/20/18 0416 04/20/18 0501 04/20/18 0601  BP: 124/75 116/68 129/79 (!) 103/54  Pulse: 85 88 85 87  Resp: 12 16 14 16   Temp:  98.3 F (36.8 C)    TempSrc:  Oral    Weight:      Height:        FHT:  FHR: 155 bpm, variability: moderate,  accelerations:  Present,  decelerations:  Absent UC:   regular, every 2-3 minutes SVE:   Dilation: 4 Effacement (%): 50 Station: -2 Exam by:: Verdie DrownJ Hazelwood, RN  Labs: Lab Results  Component Value Date   WBC 8.8 04/20/2018   HGB 9.0 (L) 04/20/2018   HCT 29.0 (L) 04/20/2018   MCV 70.7 (L) 04/20/2018   PLT 417 (H) 04/20/2018    Assessment / Plan: Induction of labor due to intrahepatic cholestasis of pregnancy,  progressing well on pitocin  Labor: Progressing normally Preeclampsia:  no signs or symptoms of toxicity, intake and ouput balanced and labs stable Fetal Wellbeing:  Category I Pain Control:  Epidural I/D:  n/a Anticipated MOD:  NSVD  Janeece RiggersEllis K Lashon Hillier 04/20/2018, 6:12 AM

## 2018-04-20 NOTE — Anesthesia Pain Management Evaluation Note (Signed)
  [facility] Pain Management Visit Note  Patient: Wendy Moses, 27 y.o., female  "Hello I am a member of the anesthesia team at [facility]. We have an anesthesia team available at all times to provide care throughout the hospital, including epidural management and anesthesia for C-section. I don't know your plan for the delivery whether it a natural birth, water birth, IV sedation, nitrous supplementation, doula or epidural, but we want to meet your pain goals."   1.Was your pain managed to your expectations on prior hospitalizations?   Yes   2.What is your expectation for pain management during this hospitalization?     Epidural  3.How can we help you reach that goal? unsure  Record the patient's initial score and the patient's pain goal.   Pain: 5  Pain Goal: 7 [facility] wants you to be able to say your pain was always managed very well.  Cephus ShellingBURGER,Teofila Bowery 04/20/2018

## 2018-04-21 LAB — CBC
HCT: 24.2 % — ABNORMAL LOW (ref 36.0–46.0)
Hemoglobin: 7.6 g/dL — ABNORMAL LOW (ref 12.0–15.0)
MCH: 22.1 pg — ABNORMAL LOW (ref 26.0–34.0)
MCHC: 31.4 g/dL (ref 30.0–36.0)
MCV: 70.3 fL — ABNORMAL LOW (ref 78.0–100.0)
Platelets: 321 10*3/uL (ref 150–400)
RBC: 3.44 MIL/uL — ABNORMAL LOW (ref 3.87–5.11)
RDW: 17.3 % — ABNORMAL HIGH (ref 11.5–15.5)
WBC: 12.4 10*3/uL — ABNORMAL HIGH (ref 4.0–10.5)

## 2018-04-21 LAB — BILE ACIDS, TOTAL: Bile Acids Total: 5.3 umol/L (ref 0.0–10.0)

## 2018-04-21 NOTE — Anesthesia Postprocedure Evaluation (Signed)
Anesthesia Post Note  Patient: Wendy Moses  Procedure(s) Performed: AN AD HOC LABOR EPIDURAL  Patient location during evaluation: Mother Baby Anesthesia Type: Epidural Level of consciousness: awake and alert and patient cooperative Pain management: satisfactory to patient Vital Signs Assessment: post-procedure vital signs reviewed and stable Respiratory status: spontaneous breathing Cardiovascular status: stable Postop Assessment: no apparent nausea or vomiting and able to ambulate Anesthetic complications: no     Last Vitals:  Vitals:   04/21/18 0522 04/21/18 1016  BP: 107/76 115/72  Pulse: 82 89  Resp: 18 20  Temp: 36.7 C 37 C  SpO2: 100%     Last Pain:  Vitals:   04/21/18 1240  TempSrc:   PainSc: 5                  Gerson Fauth

## 2018-04-21 NOTE — Progress Notes (Signed)
MOB was referred for history of depression/anxiety. * Referral screened out by Clinical Social Worker because none of the following criteria appear to apply: ~ History of anxiety/depression during this pregnancy, or of post-partum depression following prior delivery. ~ Diagnosis of anxiety and/or depression within last 3 years OR * MOB's symptoms currently being treated with medication and/or therapy. Please contact the Clinical Social Worker if needs arise, by [facility], or if MOB scores greater than 9/yes to question 10 on Edinburgh Postpartum Depression Screen.  PNR notes Rx for Vistaril.

## 2018-04-21 NOTE — Progress Notes (Signed)
Post Partum Day 1 Subjective: no complaints, up ad lib, voiding and tolerating PO. Patient had two elevated temperatures but has been afebrile since. Will order antibiotics and urinalysis with culture and sensitivity if fever returns per Dr. Normand Sloopillard.   Objective: Vitals:   04/20/18 2123 04/20/18 2216 04/20/18 2342 04/21/18 0130  BP: 117/75   109/72  Pulse: 87   81  Resp: 18   18  Temp: (!) 100.8 F (38.2 C) 100.3 F (37.9 C) 99.2 F (37.3 C) 99.4 F (37.4 C)  TempSrc:  Oral Oral Oral  SpO2: 99%   99%  Weight:      Height:        Physical Exam:  General: alert and cooperative Lochia: appropriate Uterine Fundus: firm Incision: n/a DVT Evaluation: No evidence of DVT seen on physical exam. Negative Homan's sign. No cords or calf tenderness. No significant calf/ankle edema.  Recent Labs    04/20/18 0145  HGB 9.0*  HCT 29.0*    Assessment/Plan: Plan for discharge tomorrow and Breastfeeding/bottle feeding  Patient with mild anemia, will discharge home with PO iron    LOS: 1 day   Wendy Moses 04/21/2018, 1:47 AM

## 2018-04-22 DIAGNOSIS — F419 Anxiety disorder, unspecified: Secondary | ICD-10-CM | POA: Diagnosis present

## 2018-04-22 DIAGNOSIS — O99019 Anemia complicating pregnancy, unspecified trimester: Secondary | ICD-10-CM | POA: Diagnosis present

## 2018-04-22 HISTORY — DX: Anxiety disorder, unspecified: F41.9

## 2018-04-22 HISTORY — DX: Anemia complicating pregnancy, unspecified trimester: O99.019

## 2018-04-22 MED ORDER — IBUPROFEN 600 MG PO TABS: 600.0000 mg | ORAL_TABLET | Freq: Four times a day (QID) | ORAL | 0 refills | Status: DC | PRN

## 2018-04-22 NOTE — Discharge Instructions (Signed)
Postpartum Care After Vaginal Delivery ° °The period of time right after you deliver your newborn is called the postpartum period. °What kind of medical care will I receive? °· You may continue to receive fluids and medicines through an IV tube inserted into one of your veins. °· If an incision was made near your vagina (episiotomy) or if you had some vaginal tearing during delivery, cold compresses may be placed on your episiotomy or your tear. This helps to reduce pain and swelling. °· You may be given a squirt bottle to use when you go to the bathroom. You may use this until you are comfortable wiping as usual. To use the squirt bottle, follow these steps: °? Before you urinate, fill the squirt bottle with warm water. Do not use hot water. °? After you urinate, while you are sitting on the toilet, use the squirt bottle to rinse the area around your urethra and vaginal opening. This rinses away any urine and blood. °? You may do this instead of wiping. As you start healing, you may use the squirt bottle before wiping yourself. Make sure to wipe gently. °? Fill the squirt bottle with clean water every time you use the bathroom. °· You will be given sanitary pads to wear. °How can I expect to feel? °· You may not feel the need to urinate for several hours after delivery. °· You will have some soreness and pain in your abdomen and vagina. °· If you are breastfeeding, you may have uterine contractions every time you breastfeed for up to several weeks postpartum. Uterine contractions help your uterus return to its normal size. °· It is normal to have vaginal bleeding (lochia) after delivery. The amount and appearance of lochia is often similar to a menstrual period in the first week after delivery. It will gradually decrease over the next few weeks to a dry, yellow-brown discharge. For most women, lochia stops completely by 6-8 weeks after delivery. Vaginal bleeding can vary from woman to woman. °· Within the first few  days after delivery, you may have breast engorgement. This is when your breasts feel heavy, full, and uncomfortable. Your breasts may also throb and feel hard, tightly stretched, warm, and tender. After this occurs, you may have milk leaking from your breasts. Your health care provider can help you relieve discomfort due to breast engorgement. Breast engorgement should go away within a few days. °· You may feel more sad or worried than normal due to hormonal changes after delivery. These feelings should not last more than a few days. If these feelings do not go away after several days, speak with your health care provider. °How should I care for myself? °· Tell your health care provider if you have pain or discomfort. °· Drink enough water to keep your urine clear or pale yellow. °· Wash your hands thoroughly with soap and water for at least 20 seconds after changing your sanitary pads, after using the toilet, and before holding or feeding your baby. °· If you are not breastfeeding, avoid touching your breasts a lot. Doing this can make your breasts produce more milk. °· If you become weak or lightheaded, or you feel like you might faint, ask for help before: °? Getting out of bed. °? Showering. °· Change your sanitary pads frequently. Watch for any changes in your flow, such as a sudden increase in volume, a change in color, the passing of large blood clots. If you pass a blood clot from your vagina,   save it to show to your health care provider. Do not flush blood clots down the toilet without having your health care provider look at them. °· Make sure that all your vaccinations are up to date. This can help protect you and your baby from getting certain diseases. You may need to have immunizations done before you leave the hospital. °· If desired, talk with your health care provider about methods of family planning or birth control (contraception). °How can I start bonding with my baby? °Spending as much time as  possible with your baby is very important. During this time, you and your baby can get to know each other and develop a bond. Having your baby stay with you in your room (rooming in) can give you time to get to know your baby. Rooming in can also help you become comfortable caring for your baby. Breastfeeding can also help you bond with your baby. °How can I plan for returning home with my baby? °· Make sure that you have a car seat installed in your vehicle. °? Your car seat should be checked by a certified car seat installer to make sure that it is installed safely. °? Make sure that your baby fits into the car seat safely. °· Ask your health care provider any questions you have about caring for yourself or your baby. Make sure that you are able to contact your health care provider with any questions after leaving the hospital. °This information is not intended to replace advice given to you by your health care provider. Make sure you discuss any questions you have with your health care provider. °Document Released: 06/24/2007 Document Revised: 01/30/2016 Document Reviewed: 08/01/2015 °Elsevier Interactive Patient Education © 2018 [employer] ° ° °Postpartum Depression and Baby Blues °The postpartum period begins right after the birth of a baby. During this time, there is often a great amount of joy and excitement. It is also a time of many changes in the life of the parents. Regardless of how many times a mother gives birth, each child brings new challenges and dynamics to the family. It is not unusual to have feelings of excitement along with confusing shifts in moods, emotions, and thoughts. All mothers are at risk of developing postpartum depression or the "baby blues." These mood changes can occur right after giving birth, or they may occur many months after giving birth. The baby blues or postpartum depression can be mild or severe. Additionally, postpartum depression can go away rather quickly, or it can  be a long-term condition. °What are the causes? °Raised hormone levels and the rapid drop in those levels are thought to be a main cause of postpartum depression and the baby blues. A number of hormones change during and after pregnancy. Estrogen and progesterone usually decrease right after the delivery of your baby. The levels of thyroid hormone and various cortisol steroids also rapidly drop. Other factors that play a role in these mood changes include major life events and genetics. °What increases the risk? °If you have any of the following risks for the baby blues or postpartum depression, know what symptoms to watch out for during the postpartum period. Risk factors that may increase the likelihood of getting the baby blues or postpartum depression include: °· Having a personal or family history of depression. °· Having depression while being pregnant. °· Having premenstrual mood issues or mood issues related to oral contraceptives. °· Having a lot of life stress. °· Having marital conflict. °· Lacking   a social support network. °· Having a baby with special needs. °· Having health problems, such as diabetes. ° °What are the signs or symptoms? °Symptoms of baby blues include: °· Brief changes in mood, such as going from extreme happiness to sadness. °· Decreased concentration. °· Difficulty sleeping. °· Crying spells, tearfulness. °· Irritability. °· Anxiety. ° °Symptoms of postpartum depression typically begin within the first month after giving birth. These symptoms include: °· Difficulty sleeping or excessive sleepiness. °· Marked weight loss. °· Agitation. °· Feelings of worthlessness. °· Lack of interest in activity or food. ° °Postpartum psychosis is a very serious condition and can be dangerous. Fortunately, it is rare. Displaying any of the following symptoms is cause for immediate medical attention. Symptoms of postpartum psychosis include: °· Hallucinations and delusions. °· Bizarre or disorganized  behavior. °· Confusion or disorientation. ° °How is this diagnosed? °A diagnosis is made by an evaluation of your symptoms. There are no medical or lab tests that lead to a diagnosis, but there are various questionnaires that a health care provider may use to identify those with the baby blues, postpartum depression, or psychosis. Often, a screening tool called the Edinburgh Postnatal Depression Scale is used to diagnose depression in the postpartum period. °How is this treated? °The baby blues usually goes away on its own in 1-2 weeks. Social support is often all that is needed. You will be encouraged to get adequate sleep and rest. Occasionally, you may be given medicines to help you sleep. °Postpartum depression requires treatment because it can last several months or longer if it is not treated. Treatment may include individual or group therapy, medicine, or both to address any social, physiological, and psychological factors that may play a role in the depression. Regular exercise, a healthy diet, rest, and social support may also be strongly recommended. °Postpartum psychosis is more serious and needs treatment right away. Hospitalization is often needed. °Follow these instructions at home: °· Get as much rest as you can. Nap when the baby sleeps. °· Exercise regularly. Some women find yoga and walking to be beneficial. °· Eat a balanced and nourishing diet. °· Do little things that you enjoy. Have a cup of tea, take a bubble bath, read your favorite magazine, or listen to your favorite music. °· Avoid alcohol. °· Ask for help with household chores, cooking, grocery shopping, or running errands as needed. Do not try to do everything. °· Talk to people close to you about how you are feeling. Get support from your partner, family members, friends, or other new moms. °· Try to stay positive in how you think. Think about the things you are grateful for. °· Do not spend a lot of time alone. °· Only take  over-the-counter or prescription medicine as directed by your health care provider. °· Keep all your postpartum appointments. °· Let your health care provider know if you have any concerns. °Contact a health care provider if: °You are having a reaction to or problems with your medicine. °Get help right away if: °· You have suicidal feelings. °· You think you may harm the baby or someone else. °This information is not intended to replace advice given to you by your health care provider. Make sure you discuss any questions you have with your health care provider. °Document Released: 05/31/2004 Document Revised: 02/02/2016 Document Reviewed: 06/08/2013 °Elsevier Interactive Patient Education © 2017 [employer] ° ° °

## 2018-04-22 NOTE — Addendum Note (Signed)
Addendum  created 04/22/18 1145 by Franco NonesYates, Walida Cajas S, CRNA   Charge Capture section accepted

## 2018-04-22 NOTE — Lactation Note (Addendum)
This note was copied from a baby's chart. Lactation Consultation Note: Mother breastfeeding infant when I arrived in the room. Observed frequent suckling and audible swallows.  Mother denies having any nipple discomfort.  Mother reports that her breast are filling.  Mother has a hand pump , she was advised to post pump after feedings and offer ebm for supplement .  Reviewed mother and baby book on milk storage and How do I know that my baby is getting enough.  Mother advised to feed infant with feeding cues and at least 8-12 times in 24 hours. Discussed cluster feeding.  Mother receptive to all teaching.   Reviewed Breastfeeding resources in community including [facility], out patient services and phone line 7 days a week.   Patient Name: Girl Wendy Moses ZOXWR'UToday's Date: 04/22/2018 Reason for consult: Follow-up assessment   Maternal Data    Feeding Feeding Type: Breast Fed Length of feed: 2 min  LATCH Score Latch: Grasps breast easily, tongue down, lips flanged, rhythmical sucking.  Audible Swallowing: Spontaneous and intermittent  Type of Nipple: Everted at rest and after stimulation  Comfort (Breast/Nipple): Filling, red/small blisters or bruises, mild/mod discomfort  Hold (Positioning): No assistance needed to correctly position infant at breast.  LATCH Score: 9  Interventions    Lactation Tools Discussed/Used     Consult Status Consult Status: Complete    [name], [name] 04/22/2018, 12:08 PM

## 2018-04-22 NOTE — Discharge Summary (Signed)
OB Discharge Summary     Patient Name: Wendy Moses DOB: 06/15/1991 MRN: [id number]  Date of admission: 04/20/2018 Delivering MD: Illene BolusLEMMONS, LORI A   Date of discharge: 04/22/2018  Admitting diagnosis: 37 wks induction Intrauterine pregnancy: [id number]     Secondary diagnosis:  Active Problems:   Intrahepatic cholestasis of pregnancy   Encounter for induction of labor   Anxiety   Anemia affecting pregnancy   Discharge diagnosis: Term Pregnancy Delivered                                                                                                Post partum procedures:n/a  Augmentation: AROM, Pitocin and Cytotec  Complications: None  Hospital course:  Induction of Labor With Vaginal Delivery   27 y.o. yo [phone] at 3853w0d was admitted to the hospital 04/20/2018 for induction of labor.  Indication for induction: Cholestasis of pregnancy.  Patient had an uncomplicated labor course as follows: Membrane Rupture Time/Date: 11:20 AM ,04/20/2018   Intrapartum Procedures: Episiotomy: None [1]                                         Lacerations:  1st degree [2];Perineal [11]  Patient had delivery of a Viable infant.  Information for the patient's newborn:  Wendy Moses [[id number]]      04/20/2018  Details of delivery can be found in separate delivery note.  Patient had a routine postpartum course. Patient is discharged home 04/22/18.  Physical exam  Vitals:   04/21/18 1016 04/21/18 1503 04/21/18 2245 04/22/18 0523  BP: 115/72 111/70 108/66 107/72  Pulse: 89 78 88 79  Resp: 20 18 16 18   Temp: 98.6 F (37 C) 97.9 F (36.6 C) 98.7 F (37.1 C) 98.2 F (36.8 C)  TempSrc: Oral Oral Oral Tympanic  SpO2:  100% 100%   Weight:      Height:       General: alert, cooperative and no distress Lochia: appropriate Uterine Fundus: firm Incision: N/A DVT Evaluation: No evidence of DVT seen on physical exam. Negative Homan's sign. No cords or calf tenderness. No significant  calf/ankle edema. Labs: Lab Results  Component Value Date   WBC 12.4 (H) 04/21/2018   HGB 7.6 (L) 04/21/2018   HCT 24.2 (L) 04/21/2018   MCV 70.3 (L) 04/21/2018   PLT 321 04/21/2018   CMP Latest Ref Rng & Units 04/20/2018  Glucose 65 - 99 mg/dL -  BUN 6 - 20 mg/dL -  Creatinine 1.300.44 - 8.651.00 mg/dL -  Sodium 784135 - 696145 mmol/L -  Potassium 3.5 - 5.1 mmol/L -  Chloride 101 - 111 mmol/L -  Total Protein 6.5 - 8.1 g/dL 6.9  Total Bilirubin 0.3 - 1.2 mg/dL 0.6  Alkaline Phos 38 - 126 U/L 274(H)  AST 15 - 41 U/L 21  ALT 0 - 44 U/L 19    Discharge instruction: per After Visit Summary and "Baby and Me Booklet".  After visit meds:  Allergies as of 04/22/2018  No Known Allergies     Medication List    TAKE these medications   benzonatate 100 MG capsule Commonly known as:  TESSALON Take 100 mg by mouth 3 (three) times daily as needed for cough.   FUSION 65-65-25-30 MG Caps Take 1 capsule by mouth daily.   ibuprofen 600 MG tablet Commonly known as:  ADVIL,MOTRIN Take 1 tablet (600 mg total) by mouth every 6 (six) hours as needed for mild pain or moderate pain.   prenatal multivitamin Tabs tablet Take 1 tablet by mouth daily at 12 noon.       Diet: routine diet  Activity: Advance as tolerated. Pelvic rest for 6 weeks.   Outpatient follow up:5 weeks for postpartum visit, 6 weeks for Nexplanon placement  Follow up Appt:No future appointments. Follow up Visit:No follow-ups on file.  Postpartum contraception: Nexplanon  Newborn Data: Live born female  Birth Weight: 8 lb 3.6 oz (3731 g) APGAR: 7, 9  Newborn Delivery   Birth date/time:  04/20/2018 18:10:00 Delivery type:  Vaginal, Spontaneous     Baby Feeding: Bottle and Breast Disposition:home with mother   04/22/2018 Wendy Moses, CNM

## 2018-05-19 MED ORDER — FENTANYL CITRATE (PF) 100 MCG/2ML IJ SOLN: 50.0000 ug | INTRAMUSCULAR | Status: AC | PRN

## 2018-05-19 NOTE — Addendum Note (Signed)
Addended by: Jaymes Graff on: 05/19/2018 12:27 PM   Modules accepted: Orders

## 2018-05-25 IMAGING — US US OB LIMITED
1 series · 14 of 28 positions shown · non-contrast
Comparison: none

CLINICAL DATA: Abdominal pain.  Pregnancy.

EXAM:
LIMITED OBSTETRIC ULTRASOUND

[Series 1: us ob limited · 0.23mm/px · 14 of 28 slices shown]
[im 2/28]
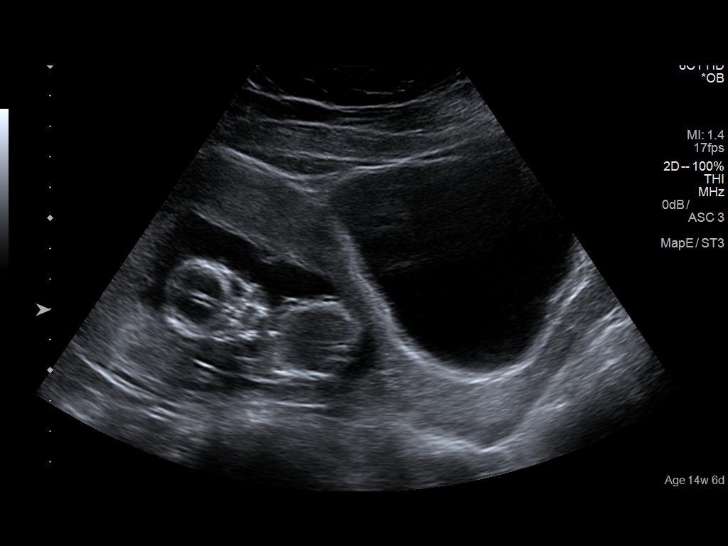
[im 4/28]
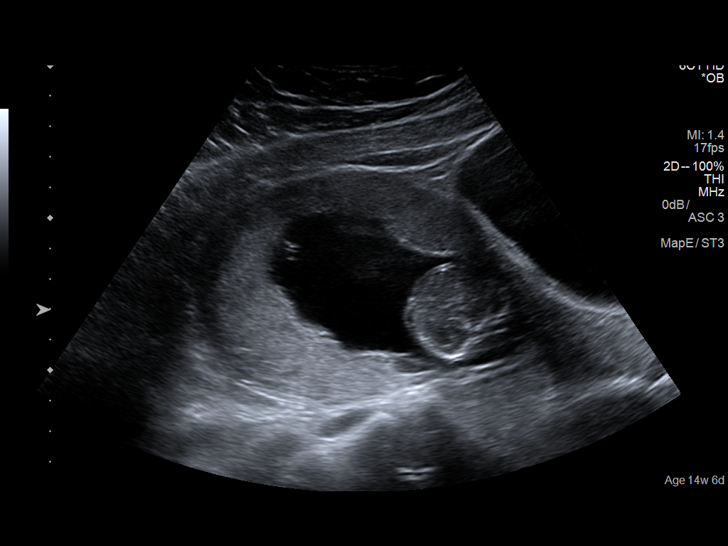
[im 6/28]
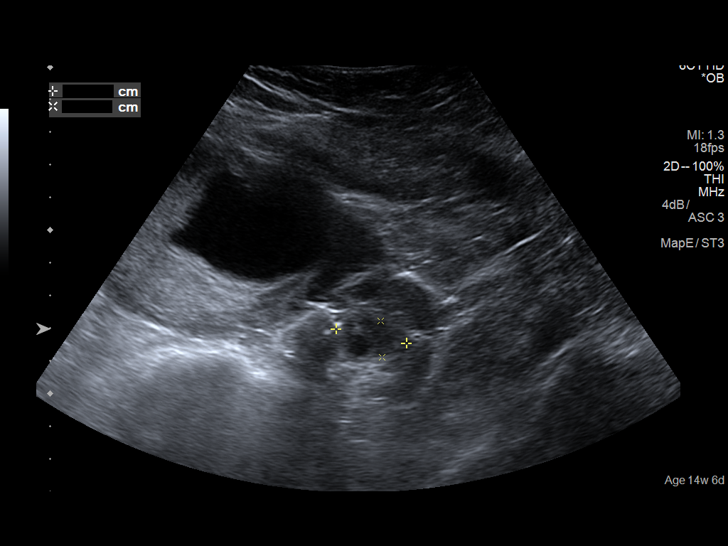
[im 8/28]
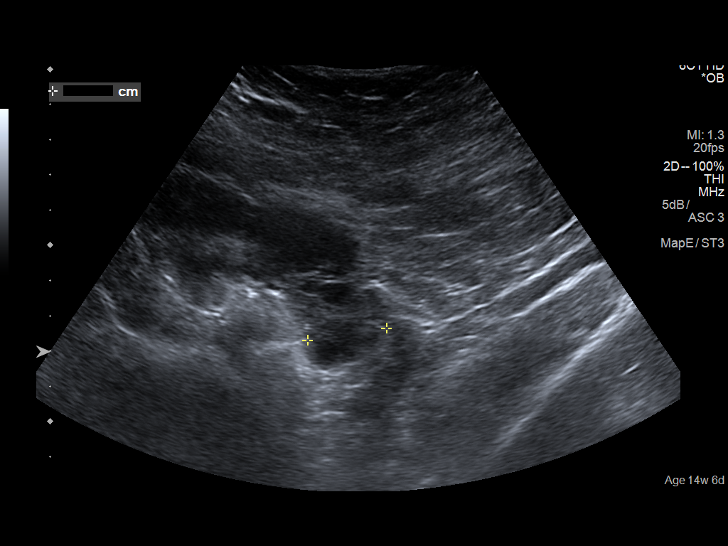
[im 10/28]
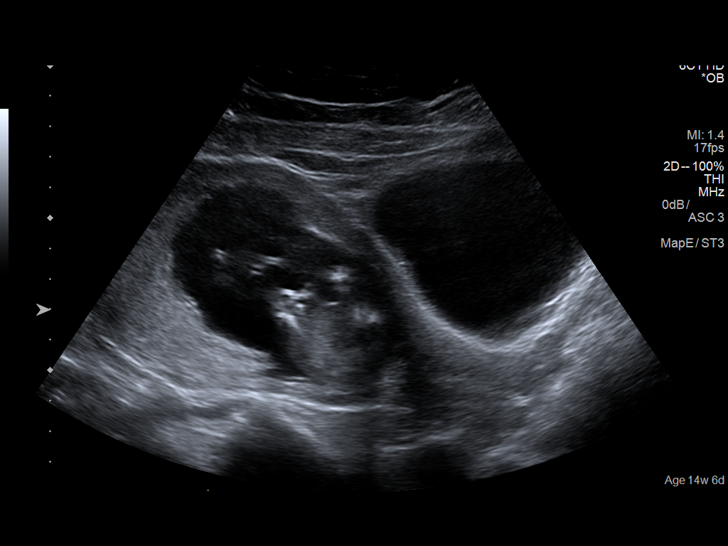
[im 12/28]
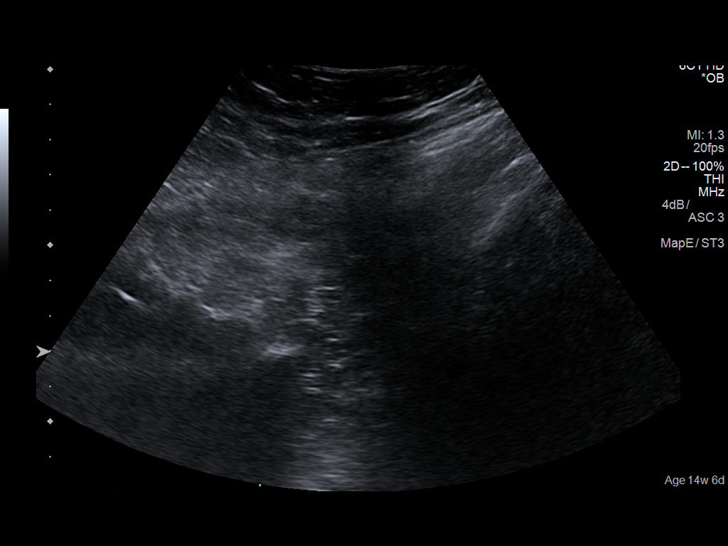
[im 14/28]
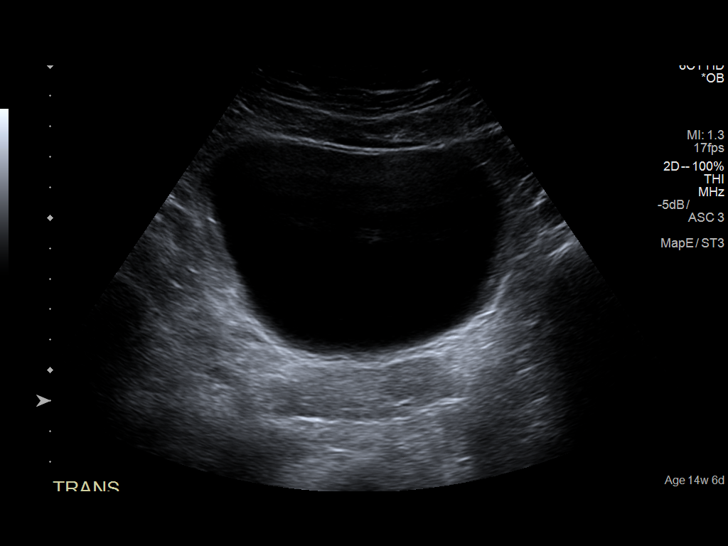
[im 16/28]
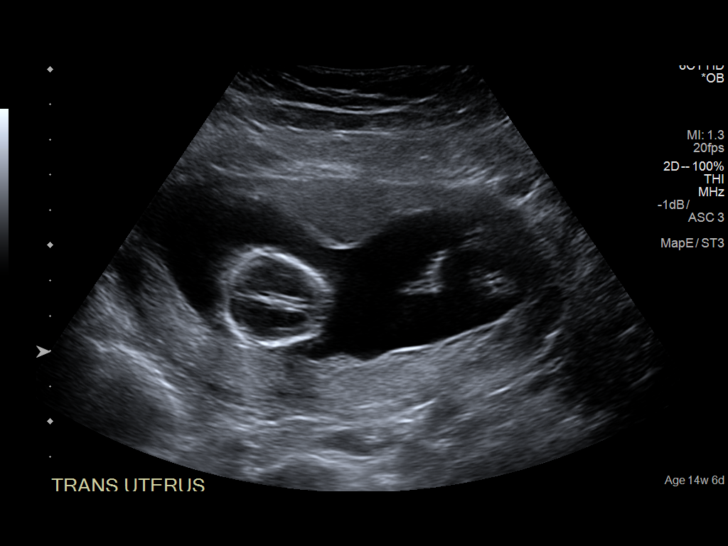
[im 18/28]
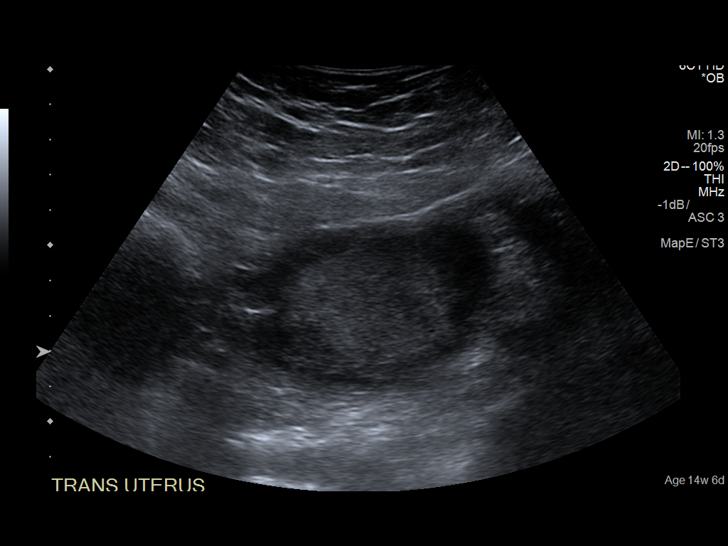
[im 20/28]
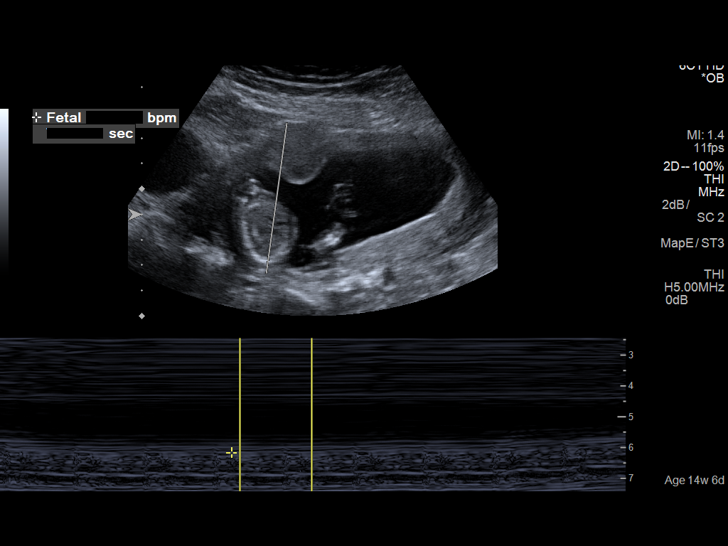
[im 22/28]
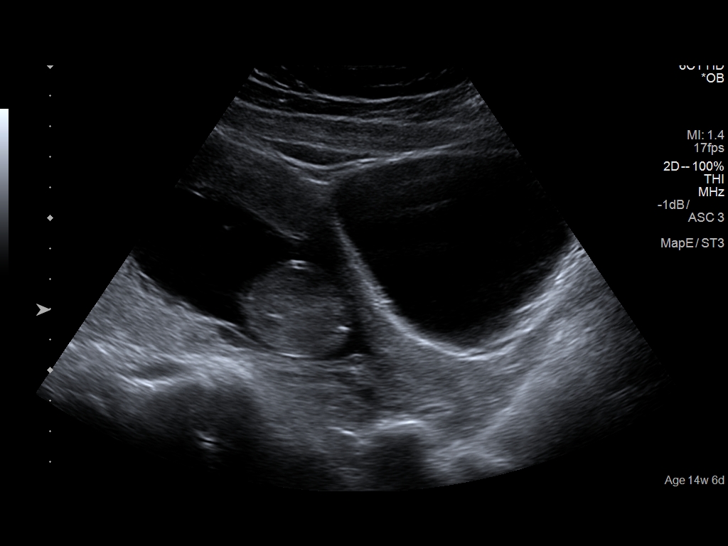
[im 24/28]
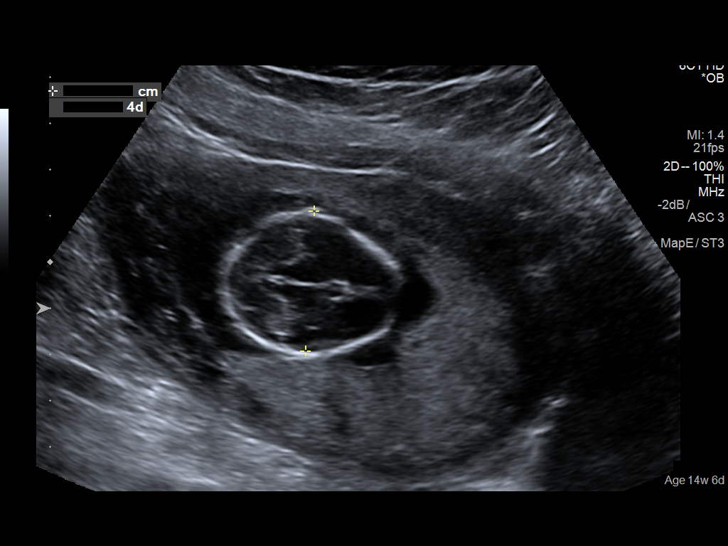
[im 26/28]
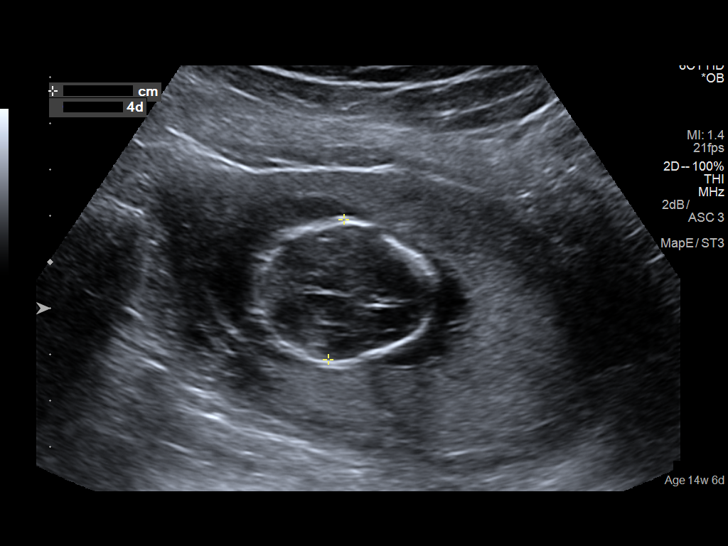
[im 28/28]
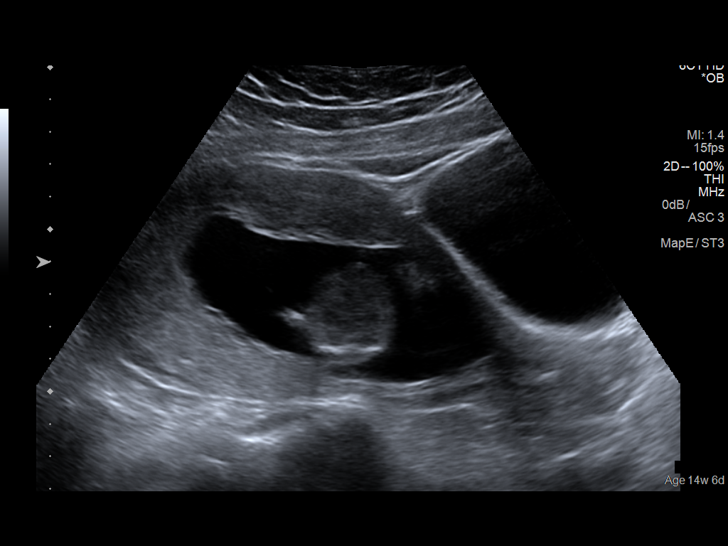

[14 of 28 positions shown; findings below may reference images not displayed]

FINDINGS: Number of Fetuses: 1

Heart Rate:  143 bpm

Movement: Present

Presentation: Transverse

Placental Location: Fundal

Previa: No

Amniotic Fluid (Subjective):  Within normal limits.

BPD:  3.0cm 15w  4d

MATERNAL FINDINGS:

Cervix:  Appears closed.

Uterus/Adnexae: No abnormality visualized. Left ovary not
visualized.
IMPRESSION: Single viable intrauterine pregnancy at 15 weeks 4 days.

This exam is performed on an emergent basis and does not
comprehensively evaluate fetal size, dating, or anatomy; follow-up
complete OB US should be considered if further fetal assessment is
warranted.

## 2022-06-25 ENCOUNTER — Encounter (INDEPENDENT_AMBULATORY_CARE_PROVIDER_SITE_OTHER): Payer: Medicaid Other | Admitting: Internal Medicine

## 2022-06-25 ENCOUNTER — Encounter (INDEPENDENT_AMBULATORY_CARE_PROVIDER_SITE_OTHER): Payer: Self-pay

## 2022-09-10 NOTE — L&D Delivery Note (Signed)
Delivery Note At 3:58 AM a viable female was delivered via Vaginal, Spontaneous (Presentation: Right Occiput Anterior).  APGAR: 8, 9; weight 8 lb 8.9 oz (3880 g).   Placenta status: Spontaneous, Intact.  Cord: 3 vessel, no nuchal with the following complications: None.  Anesthesia: Epidural Episiotomy: None Lacerations: None Suture Repair:  n/a Est. Blood Loss (mL): 1515  Mom to postpartum.  Baby to Couplet care / Skin to Skin.  NVD NOTE Date:  03/18/2023  Delivering Physician:  Reesa Chew DO Anesthesia:   1.  Laboring epidural  Pre-Delivery Diagnosis:   1.  IUP at 37 1/[redacted] weeks gestation 2.  Induced labor 3.  Intrahepatic cholestasis of pregnancy 4.  Suspected LGA   Post-Delivery Diagnosis:   Same Postpartum hemorrhage  Delivery of liveborn female neonate  Procedure:  Spontaneous vaginal delivery  QBL: 1515 mL  Complications:  None  LABOR AND DELIVERY SUMMARY The patient is a 32yo G4 now P4004 who presented at 37  0/7 weeks to Labor and Delivery for scheduled IOL for ICP.  She was admitted, placed on continuous EFM and toco.  Initial cervical exam was 2.5/40/-2 and her labor course was managed with pitocin.  She had AROM with clear fluid at 1726.  She received an epidural for pain control.   The patient progressed to completely effaced and dilated at 0229.  Patient labored down until she had an urge to push and pushing began at 0244.  A liveborn female neonate was delivered via spontaneous vaginal delivery over an intact perineum from the OA position at 0358.  There was no nuchal cord.  Spontaneous cry was noted.  No shoulder dystocia was encountered.  Infant was placed on the maternal abdomen immediately following delivery.  Oropharynx and nasopharynx were bulb suctioned immediately following delivery.  Cord was doubly clamped and cut.  Cord blood obtained. Pitocin was added to the patient's IV.  The placenta delivered spontaneously and apparently intact with a 3 vessel  cord at 0408 and discarded. Copious bleeding noted after delivery of placenta and uterine atony noted. Fundal massage along with manual evacuation of blood clots performed. Simultaneously indwelling foley catheter placed, 1000 mcg Cytotec placed PR, 0.2 IM methergine administered. Continuous bleeding noted and Jada device placed. No blood flow noted through tubing when placed to suction and concern for clot clogging tubing so device removed after approximately 10 minutes. Uterine blood clot again evacuated and uterus noted to be firm with minimal bleeding. Total QBL 1515. Usasyn 3 g IV ordered.  No perineal, cervical, vaginal, labial or periuretheral lacerations were identified.  All sponge, needle and instrument counts were correct at the end of the procedure.  The patient and baby remained in the LDR in stable condition. Dr. Hester Mates  was present for the entire delivery.  Baby Name: Kara Mead  Dr. Tim Lair D Yaira Bernardi 03/18/2023, 5:03 AM

## 2022-09-13 ENCOUNTER — Inpatient Hospital Stay (HOSPITAL_COMMUNITY)
Admission: AD | Admit: 2022-09-13 | Discharge: 2022-09-13 | Disposition: A | Discharge status: Home / Self Care | Payer: BC Managed Care – PPO | Attending: Family Medicine | Admitting: Family Medicine

## 2022-09-13 ENCOUNTER — Inpatient Hospital Stay (HOSPITAL_COMMUNITY): Payer: BC Managed Care – PPO

## 2022-09-13 ENCOUNTER — Encounter (HOSPITAL_COMMUNITY): Payer: Self-pay | Admitting: *Deleted

## 2022-09-13 DIAGNOSIS — O418X10 Other specified disorders of amniotic fluid and membranes, first trimester, not applicable or unspecified: Secondary | ICD-10-CM

## 2022-09-13 DIAGNOSIS — O208 Other hemorrhage in early pregnancy: Secondary | ICD-10-CM | POA: Diagnosis not present

## 2022-09-13 DIAGNOSIS — O209 Hemorrhage in early pregnancy, unspecified: Secondary | ICD-10-CM | POA: Diagnosis not present

## 2022-09-13 DIAGNOSIS — O26891 Other specified pregnancy related conditions, first trimester: Secondary | ICD-10-CM | POA: Diagnosis present

## 2022-09-13 DIAGNOSIS — O99011 Anemia complicating pregnancy, first trimester: Secondary | ICD-10-CM | POA: Insufficient documentation

## 2022-09-13 DIAGNOSIS — Z3A10 10 weeks gestation of pregnancy: Secondary | ICD-10-CM | POA: Diagnosis present

## 2022-09-13 DIAGNOSIS — O468X1 Other antepartum hemorrhage, first trimester: Secondary | ICD-10-CM

## 2022-09-13 DIAGNOSIS — R1030 Lower abdominal pain, unspecified: Secondary | ICD-10-CM | POA: Insufficient documentation

## 2022-09-13 LAB — CBC
HCT: 37.5 % (ref 36.0–46.0)
Hemoglobin: 12.5 g/dL (ref 12.0–15.0)
MCH: 27.9 pg (ref 26.0–34.0)
MCHC: 33.3 g/dL (ref 30.0–36.0)
MCV: 83.7 fL (ref 80.0–100.0)
Platelets: 315 10*3/uL (ref 150–400)
RBC: 4.48 MIL/uL (ref 3.87–5.11)
RDW: 13.6 % (ref 11.5–15.5)
WBC: 9.2 10*3/uL (ref 4.0–10.5)
nRBC: 0 % (ref 0.0–0.2)

## 2022-09-13 LAB — URINALYSIS, ROUTINE W REFLEX MICROSCOPIC
Bacteria, UA: NONE SEEN
Bilirubin Urine: NEGATIVE
Glucose, UA: NEGATIVE mg/dL
Ketones, ur: NEGATIVE mg/dL
Leukocytes,Ua: NEGATIVE
Nitrite: NEGATIVE
Protein, ur: NEGATIVE mg/dL
Specific Gravity, Urine: 1.018 (ref 1.005–1.030)
pH: 7 (ref 5.0–8.0)

## 2022-09-13 LAB — WET PREP, GENITAL
Clue Cells Wet Prep HPF POC: NONE SEEN
Sperm: NONE SEEN
Trich, Wet Prep: NONE SEEN
WBC, Wet Prep HPF POC: >=10 — AB (ref <10)
Yeast Wet Prep HPF POC: NONE SEEN

## 2022-09-13 LAB — POCT PREGNANCY, URINE: Preg Test, Ur: POSITIVE — AB

## 2022-09-13 LAB — HCG, QUANTITATIVE, PREGNANCY: hCG, Beta Chain, Quant, S: 137017 m[IU]/mL — ABNORMAL HIGH (ref <5)

## 2022-09-13 MED ORDER — FERROUS SULFATE 325 (65 FE) MG PO TABS: 325.0000 mg | ORAL_TABLET | Freq: Every day | ORAL | 5 refills | Status: DC

## 2022-09-13 NOTE — MAU Note (Signed)
Wendy Moses is a 32 y.o. at Unknown here in MAU reporting: before going to work, she had a brown d/c.  Then she had pain in LUQ and cramping in lower abd. Still having cramping in both areas. Saw a little more d/c  about an hour after first  episode Is pregnant, has not had confirmed yet LMP: 10/29 Onset of complaint: early this morning Pain score: moderate Vitals:   09/13/22 1210  BP: 127/75  Pulse: 96  Resp: 17  Temp: 99.2 F (37.3 C)  SpO2: 99%      Lab orders placed from triage:  UPT/UA  Vag cultures
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
## 2022-09-13 NOTE — Discharge Instructions (Signed)
OB/Gyn Offices in [facility] for [employer] @ [place]  [place] [phone]  Center for [facility] @ [place]  [phone]  [place] @ [facility]       [address] [phone]            Center for [place] @ [place]     [phone]          Center for [facility] @ [facility]   [place] [address] [phone]  Center for [facility]   [address], [employer], [place] [address] [phone]     Center for [place] @ [place] Linna Hoff)  [place]   [phone]     [place] [facility]  Phone: [place] [facility]  Phone: [facility] Phone: [phone]  [facility] Phone: [place] [facility] Phone: [phone]  [facility] Phone: [phone]  [facility]  Phone: [phone]

## 2022-09-14 LAB — GC/CHLAMYDIA PROBE AMP (~~LOC~~) NOT AT ARMC
Chlamydia: NEGATIVE
Comment: NEGATIVE
Comment: NORMAL
Neisseria Gonorrhea: NEGATIVE

## 2022-10-15 LAB — OB RESULTS CONSOLE ABO/RH: RH Type: POSITIVE

## 2022-10-15 LAB — OB RESULTS CONSOLE RUBELLA ANTIBODY, IGM: Rubella: IMMUNE

## 2022-10-15 LAB — OB RESULTS CONSOLE HIV ANTIBODY (ROUTINE TESTING): HIV: NONREACTIVE

## 2022-10-15 LAB — HEPATITIS C ANTIBODY: HCV Ab: NEGATIVE

## 2022-10-15 LAB — OB RESULTS CONSOLE ANTIBODY SCREEN: Antibody Screen: NEGATIVE

## 2022-10-15 LAB — OB RESULTS CONSOLE HEPATITIS B SURFACE ANTIGEN: Hepatitis B Surface Ag: NEGATIVE

## 2022-10-17 ENCOUNTER — Other Ambulatory Visit: Payer: Self-pay

## 2022-10-21 ENCOUNTER — Other Ambulatory Visit: Payer: Self-pay

## 2022-11-16 ENCOUNTER — Other Ambulatory Visit: Payer: Self-pay | Admitting: Obstetrics and Gynecology

## 2022-11-16 DIAGNOSIS — Z363 Encounter for antenatal screening for malformations: Secondary | ICD-10-CM

## 2022-12-20 DIAGNOSIS — R7989 Other specified abnormal findings of blood chemistry: Secondary | ICD-10-CM

## 2022-12-20 DIAGNOSIS — O0930 Supervision of pregnancy with insufficient antenatal care, unspecified trimester: Secondary | ICD-10-CM | POA: Insufficient documentation

## 2022-12-20 DIAGNOSIS — O09299 Supervision of pregnancy with other poor reproductive or obstetric history, unspecified trimester: Secondary | ICD-10-CM | POA: Insufficient documentation

## 2022-12-20 HISTORY — DX: Other specified abnormal findings of blood chemistry: R79.89

## 2022-12-27 ENCOUNTER — Ambulatory Visit: Payer: BC Managed Care – PPO | Admitting: *Deleted

## 2022-12-27 ENCOUNTER — Other Ambulatory Visit: Payer: Self-pay | Admitting: *Deleted

## 2022-12-27 ENCOUNTER — Ambulatory Visit: Payer: BC Managed Care – PPO | Attending: Obstetrics

## 2022-12-27 VITALS — BP 114/68 | HR 86

## 2022-12-27 DIAGNOSIS — Z8632 Personal history of gestational diabetes: Secondary | ICD-10-CM | POA: Insufficient documentation

## 2022-12-27 DIAGNOSIS — O0932 Supervision of pregnancy with insufficient antenatal care, second trimester: Secondary | ICD-10-CM | POA: Diagnosis present

## 2022-12-27 DIAGNOSIS — Z363 Encounter for antenatal screening for malformations: Secondary | ICD-10-CM | POA: Insufficient documentation

## 2022-12-27 DIAGNOSIS — R7989 Other specified abnormal findings of blood chemistry: Secondary | ICD-10-CM | POA: Insufficient documentation

## 2022-12-27 DIAGNOSIS — O09292 Supervision of pregnancy with other poor reproductive or obstetric history, second trimester: Secondary | ICD-10-CM

## 2022-12-27 DIAGNOSIS — O09299 Supervision of pregnancy with other poor reproductive or obstetric history, unspecified trimester: Secondary | ICD-10-CM

## 2022-12-27 DIAGNOSIS — O99212 Obesity complicating pregnancy, second trimester: Secondary | ICD-10-CM

## 2022-12-27 DIAGNOSIS — E669 Obesity, unspecified: Secondary | ICD-10-CM

## 2022-12-27 DIAGNOSIS — Z3A25 25 weeks gestation of pregnancy: Secondary | ICD-10-CM | POA: Diagnosis not present

## 2023-01-28 ENCOUNTER — Ambulatory Visit: Payer: BC Managed Care – PPO

## 2023-02-01 ENCOUNTER — Ambulatory Visit: Payer: BC Managed Care – PPO | Attending: Obstetrics and Gynecology

## 2023-02-01 ENCOUNTER — Ambulatory Visit: Payer: BC Managed Care – PPO | Admitting: *Deleted

## 2023-02-01 ENCOUNTER — Other Ambulatory Visit: Payer: Self-pay | Admitting: *Deleted

## 2023-02-01 ENCOUNTER — Other Ambulatory Visit: Payer: Self-pay | Admitting: Obstetrics and Gynecology

## 2023-02-01 VITALS — BP 129/71 | HR 120

## 2023-02-01 DIAGNOSIS — O26643 Intrahepatic cholestasis of pregnancy, third trimester: Secondary | ICD-10-CM

## 2023-02-01 DIAGNOSIS — O26649 Intrahepatic cholestasis of pregnancy, unspecified trimester: Secondary | ICD-10-CM | POA: Insufficient documentation

## 2023-02-01 DIAGNOSIS — O09293 Supervision of pregnancy with other poor reproductive or obstetric history, third trimester: Secondary | ICD-10-CM | POA: Diagnosis not present

## 2023-02-01 DIAGNOSIS — Z8632 Personal history of gestational diabetes: Secondary | ICD-10-CM

## 2023-02-01 DIAGNOSIS — K831 Obstruction of bile duct: Secondary | ICD-10-CM | POA: Diagnosis not present

## 2023-02-01 DIAGNOSIS — E669 Obesity, unspecified: Secondary | ICD-10-CM

## 2023-02-01 DIAGNOSIS — O99212 Obesity complicating pregnancy, second trimester: Secondary | ICD-10-CM | POA: Diagnosis present

## 2023-02-01 DIAGNOSIS — Z3A30 30 weeks gestation of pregnancy: Secondary | ICD-10-CM

## 2023-02-01 DIAGNOSIS — O99213 Obesity complicating pregnancy, third trimester: Secondary | ICD-10-CM

## 2023-02-08 ENCOUNTER — Encounter: Payer: Self-pay | Admitting: *Deleted

## 2023-02-12 ENCOUNTER — Ambulatory Visit: Payer: BC Managed Care – PPO | Attending: Obstetrics and Gynecology | Admitting: *Deleted

## 2023-02-12 ENCOUNTER — Ambulatory Visit: Payer: BC Managed Care – PPO | Admitting: *Deleted

## 2023-02-12 VITALS — BP 124/72 | HR 123

## 2023-02-12 DIAGNOSIS — O99213 Obesity complicating pregnancy, third trimester: Secondary | ICD-10-CM | POA: Diagnosis present

## 2023-02-12 DIAGNOSIS — Z3A32 32 weeks gestation of pregnancy: Secondary | ICD-10-CM | POA: Insufficient documentation

## 2023-02-12 DIAGNOSIS — O26643 Intrahepatic cholestasis of pregnancy, third trimester: Secondary | ICD-10-CM

## 2023-02-12 NOTE — Procedures (Cosign Needed)
Wendy Moses [birth year] [redacted]w[redacted]d  Fetus A Non-Stress Test Interpretation for 02/12/23  Indication:  obese  Fetal Heart Rate A Mode: External Baseline Rate (A): 145 bpm Variability: Moderate Accelerations: 15 x 15 Decelerations: None Multiple birth?: No  Uterine Activity Mode: Toco Contraction Frequency (min): none Resting Tone Palpated: Relaxed  Interpretation (Fetal Testing) Nonstress Test Interpretation: Reactive Overall Impression: Reassuring for gestational age Comments: Tracing reviewed byDr. Darra Lis

## 2023-02-19 ENCOUNTER — Ambulatory Visit: Payer: BC Managed Care – PPO | Admitting: *Deleted

## 2023-02-19 ENCOUNTER — Ambulatory Visit: Payer: BC Managed Care – PPO | Attending: Obstetrics and Gynecology | Admitting: *Deleted

## 2023-02-19 VITALS — BP 125/79 | HR 113

## 2023-02-19 DIAGNOSIS — O26643 Intrahepatic cholestasis of pregnancy, third trimester: Secondary | ICD-10-CM | POA: Diagnosis not present

## 2023-02-19 DIAGNOSIS — O99210 Obesity complicating pregnancy, unspecified trimester: Secondary | ICD-10-CM | POA: Insufficient documentation

## 2023-02-19 DIAGNOSIS — O99213 Obesity complicating pregnancy, third trimester: Secondary | ICD-10-CM | POA: Insufficient documentation

## 2023-02-19 DIAGNOSIS — Z3A33 33 weeks gestation of pregnancy: Secondary | ICD-10-CM | POA: Diagnosis not present

## 2023-02-19 NOTE — Procedures (Addendum)
Wendy Moses 1990-10-30 [redacted]w[redacted]d  Fetus A Non-Stress Test Interpretation for 02/19/23  Indication:  Cholestasis, obesity  Fetal Heart Rate A Mode: External Baseline Rate (A): 160 bpm Variability: Moderate Accelerations: 15 x 15 Decelerations: None Multiple birth?: No  Uterine Activity Mode: Palpation, Toco Contraction Frequency (min): None  Interpretation (Fetal Testing) Nonstress Test Interpretation: Reactive Comments: Dr. Darra Lis reviewed tracing.

## 2023-02-22 ENCOUNTER — Other Ambulatory Visit: Payer: Self-pay | Admitting: Obstetrics and Gynecology

## 2023-02-26 ENCOUNTER — Ambulatory Visit: Payer: BC Managed Care – PPO | Admitting: *Deleted

## 2023-02-26 ENCOUNTER — Ambulatory Visit: Payer: BC Managed Care – PPO | Attending: Obstetrics and Gynecology

## 2023-02-26 ENCOUNTER — Encounter: Payer: Self-pay | Admitting: *Deleted

## 2023-02-26 DIAGNOSIS — O283 Abnormal ultrasonic finding on antenatal screening of mother: Secondary | ICD-10-CM

## 2023-02-26 DIAGNOSIS — O99213 Obesity complicating pregnancy, third trimester: Secondary | ICD-10-CM | POA: Diagnosis not present

## 2023-02-26 DIAGNOSIS — O09293 Supervision of pregnancy with other poor reproductive or obstetric history, third trimester: Secondary | ICD-10-CM

## 2023-02-26 DIAGNOSIS — O26613 Liver and biliary tract disorders in pregnancy, third trimester: Secondary | ICD-10-CM

## 2023-02-26 DIAGNOSIS — Z3A34 34 weeks gestation of pregnancy: Secondary | ICD-10-CM

## 2023-02-26 DIAGNOSIS — E669 Obesity, unspecified: Secondary | ICD-10-CM

## 2023-02-26 DIAGNOSIS — K831 Obstruction of bile duct: Secondary | ICD-10-CM

## 2023-02-26 NOTE — Procedures (Cosign Needed)
Wendy Moses 21-Jun-1991 [redacted]w[redacted]d  Fetus A Non-Stress Test Interpretation for 02/26/23  Indication: Unsatisfactory BPP  Fetal Heart Rate A Mode: External Baseline Rate (A): 140 bpm Variability: Moderate Accelerations: 15 x 15 Multiple birth?: No  Uterine Activity Mode: Palpation, Toco Contraction Frequency (min): Erratic UI Contraction Quality: Mild Resting Tone Palpated: Relaxed Resting Time: Adequate  Interpretation (Fetal Testing) Nonstress Test Interpretation: Reactive Comments: Dr. Darra Lis reviewed tracing

## 2023-03-05 ENCOUNTER — Encounter: Payer: Self-pay | Admitting: *Deleted

## 2023-03-05 ENCOUNTER — Other Ambulatory Visit: Payer: Self-pay | Admitting: Obstetrics and Gynecology

## 2023-03-05 ENCOUNTER — Ambulatory Visit: Payer: BC Managed Care – PPO | Attending: Obstetrics and Gynecology

## 2023-03-05 ENCOUNTER — Encounter (HOSPITAL_COMMUNITY): Payer: Self-pay | Admitting: *Deleted

## 2023-03-05 ENCOUNTER — Telehealth (HOSPITAL_COMMUNITY): Payer: Self-pay | Admitting: *Deleted

## 2023-03-05 ENCOUNTER — Ambulatory Visit: Payer: BC Managed Care – PPO | Admitting: *Deleted

## 2023-03-05 DIAGNOSIS — E669 Obesity, unspecified: Secondary | ICD-10-CM | POA: Diagnosis not present

## 2023-03-05 DIAGNOSIS — Z3A35 35 weeks gestation of pregnancy: Secondary | ICD-10-CM

## 2023-03-05 DIAGNOSIS — O26613 Liver and biliary tract disorders in pregnancy, third trimester: Secondary | ICD-10-CM

## 2023-03-05 DIAGNOSIS — O99213 Obesity complicating pregnancy, third trimester: Secondary | ICD-10-CM

## 2023-03-05 DIAGNOSIS — K831 Obstruction of bile duct: Secondary | ICD-10-CM | POA: Diagnosis not present

## 2023-03-05 DIAGNOSIS — O0933 Supervision of pregnancy with insufficient antenatal care, third trimester: Secondary | ICD-10-CM

## 2023-03-05 NOTE — Telephone Encounter (Signed)
Preadmission screen  

## 2023-03-06 LAB — OB RESULTS CONSOLE GBS: GBS: NEGATIVE

## 2023-03-11 DIAGNOSIS — D649 Anemia, unspecified: Secondary | ICD-10-CM

## 2023-03-11 HISTORY — DX: Anemia, unspecified: D64.9

## 2023-03-17 ENCOUNTER — Observation Stay (HOSPITAL_COMMUNITY): Payer: BC Managed Care – PPO | Admitting: Anesthesiology

## 2023-03-17 ENCOUNTER — Inpatient Hospital Stay (HOSPITAL_COMMUNITY)
Admission: RE | Admit: 2023-03-17 | Discharge: 2023-03-17 | Disposition: A | Discharge status: Home / Self Care | Payer: BC Managed Care – PPO | Source: Ambulatory Visit | Attending: Family Medicine | Admitting: Family Medicine

## 2023-03-17 ENCOUNTER — Inpatient Hospital Stay (HOSPITAL_COMMUNITY)
Admission: RE | Admit: 2023-03-17 | Discharge: 2023-03-19 | DRG: 768 | Disposition: A | Discharge status: Home / Self Care | Payer: BC Managed Care – PPO | Attending: Obstetrics and Gynecology | Admitting: Obstetrics and Gynecology

## 2023-03-17 ENCOUNTER — Encounter (HOSPITAL_COMMUNITY): Payer: Self-pay | Admitting: Obstetrics and Gynecology

## 2023-03-17 ENCOUNTER — Other Ambulatory Visit: Payer: Self-pay

## 2023-03-17 DIAGNOSIS — K831 Obstruction of bile duct: Secondary | ICD-10-CM | POA: Diagnosis present

## 2023-03-17 DIAGNOSIS — Z3A37 37 weeks gestation of pregnancy: Secondary | ICD-10-CM | POA: Diagnosis not present

## 2023-03-17 DIAGNOSIS — O9902 Anemia complicating childbirth: Secondary | ICD-10-CM | POA: Diagnosis present

## 2023-03-17 DIAGNOSIS — O99019 Anemia complicating pregnancy, unspecified trimester: Secondary | ICD-10-CM | POA: Diagnosis present

## 2023-03-17 DIAGNOSIS — O3663X0 Maternal care for excessive fetal growth, third trimester, not applicable or unspecified: Secondary | ICD-10-CM | POA: Diagnosis present

## 2023-03-17 DIAGNOSIS — O99214 Obesity complicating childbirth: Secondary | ICD-10-CM | POA: Diagnosis present

## 2023-03-17 DIAGNOSIS — O26643 Intrahepatic cholestasis of pregnancy, third trimester: Secondary | ICD-10-CM | POA: Diagnosis present

## 2023-03-17 LAB — COMPREHENSIVE METABOLIC PANEL
ALT: 15 U/L (ref 0–44)
AST: 27 U/L (ref 15–41)
Albumin: 2.1 g/dL — ABNORMAL LOW (ref 3.5–5.0)
Alkaline Phosphatase: 190 U/L — ABNORMAL HIGH (ref 38–126)
Anion gap: 12 (ref 5–15)
BUN: 5 mg/dL — ABNORMAL LOW (ref 6–20)
CO2: 17 mmol/L — ABNORMAL LOW (ref 22–32)
Calcium: 8.5 mg/dL — ABNORMAL LOW (ref 8.9–10.3)
Chloride: 106 mmol/L (ref 98–111)
Creatinine, Ser: 0.54 mg/dL (ref 0.44–1.00)
GFR, Estimated: >60 mL/min (ref >60)
Glucose, Bld: 174 mg/dL — ABNORMAL HIGH (ref 70–99)
Potassium: 4.2 mmol/L (ref 3.5–5.1)
Sodium: 135 mmol/L (ref 135–145)
Total Bilirubin: 0.9 mg/dL (ref 0.3–1.2)
Total Protein: 5.6 g/dL — ABNORMAL LOW (ref 6.5–8.1)

## 2023-03-17 LAB — CBC
HCT: 28.8 % — ABNORMAL LOW (ref 36.0–46.0)
Hemoglobin: 9 g/dL — ABNORMAL LOW (ref 12.0–15.0)
MCH: 23.3 pg — ABNORMAL LOW (ref 26.0–34.0)
MCHC: 31.3 g/dL (ref 30.0–36.0)
MCV: 74.6 fL — ABNORMAL LOW (ref 80.0–100.0)
Platelets: 412 10*3/uL — ABNORMAL HIGH (ref 150–400)
RBC: 3.86 MIL/uL — ABNORMAL LOW (ref 3.87–5.11)
RDW: 16 % — ABNORMAL HIGH (ref 11.5–15.5)
WBC: 8.1 10*3/uL (ref 4.0–10.5)
nRBC: 0.4 % — ABNORMAL HIGH (ref 0.0–0.2)

## 2023-03-17 LAB — TYPE AND SCREEN
ABO/RH(D): A POS
Antibody Screen: NEGATIVE

## 2023-03-17 MED ORDER — EPHEDRINE 5 MG/ML INJ: 10.0000 mg | INTRAVENOUS | Status: DC | PRN

## 2023-03-17 MED ORDER — SOD CITRATE-CITRIC ACID 500-334 MG/5ML PO SOLN: 30.0000 mL | ORAL | Status: DC | PRN

## 2023-03-17 MED ORDER — ONDANSETRON HCL 4 MG/2ML IJ SOLN
4.0000 mg | Freq: Four times a day (QID) | INTRAMUSCULAR | Status: DC | PRN
  Administered 2023-03-18: 4 mg via INTRAVENOUS
  Filled 2023-03-17: qty 2

## 2023-03-17 MED ORDER — DIPHENHYDRAMINE HCL 50 MG/ML IJ SOLN: 12.5000 mg | INTRAMUSCULAR | Status: DC | PRN

## 2023-03-17 MED ORDER — OXYTOCIN-SODIUM CHLORIDE 30-0.9 UT/500ML-% IV SOLN: 1.0000 m[IU]/min | INTRAVENOUS | Status: DC

## 2023-03-17 MED ORDER — LIDOCAINE HCL (PF) 1 % IJ SOLN: 30.0000 mL | INTRAMUSCULAR | Status: DC | PRN

## 2023-03-17 MED ORDER — LACTATED RINGERS IV SOLN: INTRAVENOUS | Status: DC

## 2023-03-17 MED ORDER — FENTANYL-BUPIVACAINE-NACL 0.5-0.125-0.9 MG/250ML-% EP SOLN: 12.0000 mL/h | EPIDURAL | Status: DC | PRN

## 2023-03-17 MED ORDER — PHENYLEPHRINE 80 MCG/ML (10ML) SYRINGE FOR IV PUSH (FOR BLOOD PRESSURE SUPPORT): 80.0000 ug | PREFILLED_SYRINGE | INTRAVENOUS | Status: DC | PRN

## 2023-03-17 MED ORDER — LACTATED RINGERS IV SOLN
500.0000 mL | Freq: Once | INTRAVENOUS | Status: AC
  Administered 2023-03-17: 500 mL via INTRAVENOUS

## 2023-03-17 MED ORDER — LACTATED RINGERS IV SOLN: 500.0000 mL | INTRAVENOUS | Status: DC | PRN

## 2023-03-17 MED ORDER — TERBUTALINE SULFATE 1 MG/ML IJ SOLN: 0.2500 mg | Freq: Once | INTRAMUSCULAR | Status: DC | PRN

## 2023-03-17 MED ORDER — OXYTOCIN-SODIUM CHLORIDE 30-0.9 UT/500ML-% IV SOLN
2.5000 [IU]/h | INTRAVENOUS | Status: DC
  Administered 2023-03-18: 2.5 [IU]/h via INTRAVENOUS

## 2023-03-17 MED ORDER — ACETAMINOPHEN 325 MG PO TABS
650.0000 mg | ORAL_TABLET | ORAL | Status: DC | PRN
  Administered 2023-03-18: 650 mg via ORAL
  Filled 2023-03-17: qty 2

## 2023-03-17 MED ORDER — FENTANYL CITRATE (PF) 100 MCG/2ML IJ SOLN
50.0000 ug | INTRAMUSCULAR | Status: DC | PRN
  Filled 2023-03-17: qty 2

## 2023-03-17 MED ORDER — OXYTOCIN-SODIUM CHLORIDE 30-0.9 UT/500ML-% IV SOLN
1.0000 m[IU]/min | INTRAVENOUS | Status: DC
  Administered 2023-03-17: 1 m[IU]/min via INTRAVENOUS
  Filled 2023-03-17: qty 500

## 2023-03-17 MED ORDER — FENTANYL-BUPIVACAINE-NACL 0.5-0.125-0.9 MG/250ML-% EP SOLN
12.0000 mL/h | EPIDURAL | Status: DC | PRN
  Administered 2023-03-17: 12 mL/h via EPIDURAL
  Filled 2023-03-17: qty 250

## 2023-03-17 MED ORDER — OXYTOCIN BOLUS FROM INFUSION
333.0000 mL | Freq: Once | INTRAVENOUS | Status: AC
  Administered 2023-03-18: 333 mL via INTRAVENOUS

## 2023-03-17 MED ORDER — FLEET ENEMA 7-19 GM/118ML RE ENEM: 1.0000 | ENEMA | RECTAL | Status: DC | PRN

## 2023-03-17 MED ORDER — LIDOCAINE HCL (PF) 1 % IJ SOLN
INTRAMUSCULAR | Status: DC | PRN
  Administered 2023-03-17: 8 mL via EPIDURAL

## 2023-03-17 NOTE — H&P (Signed)
Wendy Moses is a 32 y.o. female, G4P3003, IUP at 37 weeks, presenting for IOL for Cholestasis (BPP weekly or NST x2 starting at 32 weeks; Growth [place] every 4 weeks. Rx for Ursodiol 300 mg BID, Rx for Hydroxyzine 25 mg prn; Delivery at 37 weeks, AST 44 and ALT 128 on 02/01/2023, Bile acids 15.4 on 5/16). Late entry into prenatal care (15 weeks). Suspected LGA. Anemia (hgb 10.2 on po Iron). Pt endorse + Fm. Denies vaginal leakage. Denies vaginal bleeding. Denies feeling cxt's.    Patient Active Problem List   Diagnosis Date Noted   Cholestasis 03/17/2023   Obesity affecting pregnancy 02/19/2023   Cholestasis during pregnancy 02/01/2023   History of gestational diabetes mellitus (GDM) in prior pregnancy, currently pregnant 12/20/2022   Late prenatal care affecting pregnancy 12/20/2022     Active Ambulatory Problems    Diagnosis Date Noted   History of gestational diabetes mellitus (GDM) in prior pregnancy, currently pregnant 12/20/2022   Late prenatal care affecting pregnancy 12/20/2022   Cholestasis during pregnancy 02/01/2023   Obesity affecting pregnancy 02/19/2023   Resolved Ambulatory Problems    Diagnosis Date Noted   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   03/16/2023   Prenatal Vit-Fe Fumarate-FA (PRENATAL MULTIVITAMIN) TABS tablet Take 1 tablet by mouth daily at 12 noon.   03/16/2023   Ursodiol (ACTIGALL PO) Take by mouth.   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     Prenatal Vit-Fe Fumarate-FA (PRENATAL MULTIVITAMIN) TABS tablet Take 1 tablet by mouth daily at 12 noon.     Ursodiol (ACTIGALL PO) Take by mouth.       No Known Allergies  History of present pregnancy: Pt Info/Preference:  Screening/Consents:  Labs:   EDD: Estimated Date of Delivery: 04/07/23  Establised: Patient's last menstrual period was 07/04/2022.  Anatomy Scan: Date: 12/28/2022 Placenta Location: posterior Genetic Screen: Panoroma:LR AFP: Neg First Tri: Quad: Horizon: Neg  Office: [facility]            First PNV: 15.1 weeks Blood Type A/Positive/-- (02/05 0000)  Language: english Last PNV: 36.2 weeks Rhogam    Flu Vaccine:  declined   Antibody Negative (02/05 0000)  TDaP vaccine utd   GTT: Early: 5.6 Third Trimester: pass 3h  gtt  Feeding Plan: breast BTL: no Rubella: Immune (02/05 0000)  Contraception: ??? VBAC: no RPR:   Immune  Circumcision: ???   HBsAg: Negative (02/05 0000)  Pediatrician:  ???   HIV: Non-reactive (02/05 0000)   Prenatal Classes: no Additional [place]: 6/25 @ 35.3 weeks, posterior afi 23, BPP 8/8, EWF 8.3lbs, >99% GBS:  Negative 6/26 (For PCN allergy, check sensitivities)       Chlamydia: neg    MFM Referral/Consult:  GC: neg  Support Person: partner   PAP: 2024  Pain Management: epidural Neonatologist  Referral:  Hgb Electrophoresis:  AA  Birth Plan: dcc   Hgb NOB: 12.0    28W: 10.2   OB History     Gravida  4   Para  3   Term  3   Preterm  0   AB  0   Living  3      SAB  0   IAB  0   Ectopic  0   Multiple  0   Live Births  3          Past Medical History:  Diagnosis Date   Anemia affecting pregnancy 04/22/2018   Anxiety 04/22/2018   Cholestasis during pregnancy    Encounter for induction of labor 04/20/2018   Gestational diabetes    first pregnancy   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 Social History:  reports that she has never smoked. She has never used smokeless tobacco. She reports that she does not drink alcohol and does not use drugs.   Prenatal Transfer Tool  Maternal Diabetes: No Genetic Screening: Normal Maternal Ultrasounds/Referrals: Normal Fetal Ultrasounds or other Referrals:  Referred to Materal Fetal Medicine  Maternal Substance Abuse:  No Significant Maternal Medications:  Meds include: Other: ursidiol Significant Maternal Lab Results: Group B Strep negative  ROS:  Review of Systems  Constitutional: Negative.   HENT: Negative.    Eyes: Negative.   Respiratory: Negative.    Cardiovascular: Negative.   Gastrointestinal: Negative.   Genitourinary: Negative.   Musculoskeletal: Negative.   Skin: Negative.   Neurological: Negative.   Endo/Heme/Allergies: Negative.   Psychiatric/Behavioral: Negative.       Physical Exam: BP (!) 144/83 (BP Location: Right Arm)   Pulse (!) 130   Temp 98.7 F (37.1 C) (Oral)   Resp 17   Ht 5\' 4"  (1.626 m)   Wt 101.6 kg   LMP 07/04/2022   SpO2 100%   BMI 38.45 kg/m   Physical Exam Vitals and nursing note reviewed.  Constitutional:      Appearance: Normal  appearance.  HENT:     Head: Normocephalic and atraumatic.     Nose: Nose normal.     Mouth/Throat:     Mouth: Mucous membranes are moist.  Eyes:     Pupils: Pupils are equal, round, and reactive to light.  Cardiovascular:     Rate and Rhythm: Normal rate and regular rhythm.     Pulses: Normal pulses.     Heart sounds: Normal heart sounds.  Pulmonary:     Effort: Pulmonary effort is normal.     Breath sounds: Normal breath sounds.  Abdominal:     General: Bowel sounds are normal.  Genitourinary:  Comments: Uterus gravida equal to dates, pelvis adequate for vaginal delivery  Musculoskeletal:        General: Normal range of motion.     Cervical back: Normal range of motion and neck supple.  Skin:    General: Skin is warm.     Capillary Refill: Capillary refill takes less than 2 seconds.  Neurological:     General: No focal deficit present.     Mental Status: She is alert.  Psychiatric:        Mood and Affect: Mood normal.      NST: FHR baseline 145 bpm, Variability: moderate, Accelerations:present, Decelerations:  Absent= Cat 1/Reactive UC:   occ SVE:   Dilation: 2.5 Effacement (%): 40 Station: -2 Exam by:: [employer] RN, vertex verified by fetal sutures.  Leopold's: Position vertex, EFW 8.5lbs via leopold's.   Labs: Results for orders placed or performed during the hospital encounter of 03/17/23 (from the past 24 hour(s))  CBC     Status: Abnormal   Collection Time: 03/17/23  3:13 PM  Result Value Ref Range   WBC 8.1 4.0 - 10.5 K/uL   RBC 3.86 (L) 3.87 - 5.11 MIL/uL   Hemoglobin 9.0 (L) 12.0 - 15.0 g/dL   HCT 16.1 (L) 09.6 - 04.5 %   MCV 74.6 (L) 80.0 - 100.0 fL   MCH 23.3 (L) 26.0 - 34.0 pg   MCHC 31.3 30.0 - 36.0 g/dL   RDW 40.9 (H) 81.1 - 91.4 %   Platelets 412 (H) 150 - 400 K/uL   nRBC 0.4 (H) 0.0 - 0.2 %    Imaging:  [place] MFM OB FOLLOW UP  Result Date: 03/05/2023 ----------------------------------------------------------------------  OBSTETRICS  REPORT                    (Corrected Final 03/05/2023 05:20 pm) ---------------------------------------------------------------------- Patient Info  ID #:       [id number]                          D.O.B.:  [birth year] (32 yrs)  Name:       Ebbie Ridge                 Visit Date: 03/05/2023 04:48 pm ---------------------------------------------------------------------- Performed By  Attending:        Noralee Space MD        Ref. Address:     [facility] &                                                             Gynecology  [address]                                                             [address]                                                             [place], [place]                                                             [address]  Performed By:     Kris Hartmann,      Location:         [facility]                                     [facility]  Referred By:      Nigel Bridgeman                    CNM ---------------------------------------------------------------------- Orders  #  Description                           Code        Ordered By  1  [place] MFM OB FOLLOW UP                   60454.09    RAVI SHANKAR  2  [place] MFM FETAL BPP WO NON               81191.47    RAVI SHANKAR     STRESS ----------------------------------------------------------------------  #  Order #  Accession #                Episode #  1  [id number]                   [id number]                 [id number]  2  [id number]                   [id number]                 [id number] ----------------------------------------------------------------------  Indications  Cholestasis of pregnancy, third trimester      G64.403K74.2  [redacted] weeks gestation of pregnancy                Z3A.35  Obesity complicating pregnancy, third          O99.213  trimester (BMI 34)  Poor obstetric history: Previous gestational   O09.299  diabetes  LR NIPS/AFP negative/Horizon negative  Late to prenatal care, third trimester         O09.33 ---------------------------------------------------------------------- Fetal Evaluation  Num Of Fetuses:         1  Fetal Heart Rate(bpm):  158  Cardiac Activity:       Observed  Presentation:           Cephalic  Placenta:               Posterior  P. Cord Insertion:      Previously visualized  Amniotic Fluid  AFI FV:      Within normal limits  AFI Sum(cm)     %Tile       Largest Pocket(cm)  23.39           89          6.45  RUQ(cm)       RLQ(cm)       LUQ(cm)        LLQ(cm)  6.45          5.78          4.8            6.36 ---------------------------------------------------------------------- Biophysical Evaluation  Amniotic F.V:   Pocket => 2 cm             F. Tone:        Observed  F. Movement:    Observed                   Score:          8/8  F. Breathing:   Observed ---------------------------------------------------------------------- Biometry  BPD:      93.4  mm     G. Age:  38w 0d         98  %    CI:        75.64   %    70 - 86                                                          FL/HC:      20.3   %    20.1 - 22.3  HC:      340.5  mm     G. Age:  39w 1d  95  %    HC/AC:      0.92        0.93 - 1.11  AC:      369.1  mm     G. Age:  40w 6d       > 99  %    FL/BPD:     74.1   %    71 - 87  FL:       69.2  mm     G. Age:  35w 4d         50  %    FL/AC:      18.7   %    20 - 24  HUM:      57.4  mm     G. Age:  33w 2d         27  %  LV:        5.2  mm  Est. FW:    3724  gm      8 lb 3 oz   > 99  % ---------------------------------------------------------------------- OB History  Gravidity:    4         Term:   3        Prem:   0        SAB:   0   TOP:          0       Ectopic:  0        Living: 3 ---------------------------------------------------------------------- Gestational Age  LMP:           34w 6d        Date:  07/04/22                 EDD:   04/10/23  U/S Today:     38w 3d                                        EDD:   03/16/23  Best:          35w 2d     Det. ByMarcella Dubs         EDD:   04/07/23                                      (09/13/22) ---------------------------------------------------------------------- Anatomy  Cranium:               Appears normal         LVOT:                   Previously seen  Cavum:                 Previously seen        Aortic Arch:            Previously seen  Ventricles:            Previously seen        Ductal Arch:            Previously seen  Choroid Plexus:        Previously seen        Diaphragm:              Appears normal  Cerebellum:  Previously seen        Stomach:                Appears normal, left                                                                        sided  Posterior Fossa:       Previously seen        Abdomen:                Appears normal  Nuchal Fold:           Previously seen        Abdominal Wall:         Previously seen  Face:                  Orbits and profile     Cord Vessels:           Appears normal (3                         previously seen                                vessel cord)  Lips:                  Previously seen        Kidneys:                Appear normal  Palate:                Not well visualized    Bladder:                Appears normal  Thoracic:              Appears normal         Spine:                  Previously seen  Heart:                 Previously seen        Upper Extremities:      Previously seen  RVOT:                  Previously seen        Lower Extremities:      Previously seen  Other:  SVC/IVC, 3VV, 3VTV previously visualized. Hands and feet          previously visualized. Heels previously seen. Nasal bone, lenses,          maxilla,  mandible and falx previously visualized. ---------------------------------------------------------------------- Cervix Uterus Adnexa  Right Ovary  Not visualized.  Left Ovary  Not visualized. ---------------------------------------------------------------------- Impression  Cholestasis in pregnancy.  The estimated fetal weight and the abdominal circumference  measurements are at the 99th percentiles..Amniotic fluid is  normal and good fetal activity is seen .Antenatal testing is  reassuring. BPP 8/8.  Cephalic presentation.  Ultrasound has limitations in accurately estimating fetal  weights.  She does not have gestational diabetes.  We reassured the patient of  the findings. ---------------------------------------------------------------------- Recommendations  -NST at your office next week.  -IOL on 03/17/23 (scheduled by her provider). ----------------------------------------------------------------------                      Noralee Space, MD Electronically Signed Corrected Final Report  03/05/2023 05:20 pm ----------------------------------------------------------------------  [place] MFM FETAL BPP WO NON STRESS  Result Date: 03/05/2023 ----------------------------------------------------------------------  OBSTETRICS REPORT                    (Corrected Final 03/05/2023 05:20 pm) ---------------------------------------------------------------------- Patient Info  ID #:       [id number]                          D.O.B.:  1990-10-15 (32 yrs)  Name:       Ebbie Ridge                 Visit Date: 03/05/2023 04:48 pm ---------------------------------------------------------------------- Performed By  Attending:        Noralee Space MD        Ref. Address:     [facility] &                                                             Gynecology                                                             [address]                                                             [address]                                                             [place], [place]  [address]  Performed By:     Kris Hartmann,      Location:         [facility]                    [facility]                                     [facility]                                                             [facility]  Referred By:      Nigel Bridgeman                    CNM ---------------------------------------------------------------------- Orders  #  Description                           Code        Ordered By  1  [place] MFM OB FOLLOW UP                   76816.01    RAVI SHANKAR  2  [place] MFM FETAL BPP WO NON               76819.01    RAVI [facility]     STRESS ----------------------------------------------------------------------  #  Order #                     Accession #                Episode #  1  [id number]                   [id number]                 [id number]  2  [id number]                   [id number]                 [id number] ---------------------------------------------------------------------- Indications  Cholestasis of pregnancy, third trimester      J18.841Y60.6  [redacted] weeks gestation of pregnancy                Z3A.35  Obesity complicating pregnancy, third          O99.213  trimester (BMI 34)  Poor obstetric history: Previous gestational   O09.299  diabetes  LR NIPS/AFP negative/Horizon negative  Late to prenatal care, third trimester         O09.33 ---------------------------------------------------------------------- Fetal Evaluation  Num Of Fetuses:         1  Fetal Heart Rate(bpm):  158  Cardiac Activity:       Observed  Presentation:  Cephalic  Placenta:               Posterior  P. Cord Insertion:      Previously visualized  Amniotic Fluid  AFI FV:      Within normal limits  AFI  Sum(cm)     %Tile       Largest Pocket(cm)  23.39           89          6.45  RUQ(cm)       RLQ(cm)       LUQ(cm)        LLQ(cm)  6.45          5.78          4.8            6.36 ---------------------------------------------------------------------- Biophysical Evaluation  Amniotic F.V:   Pocket => 2 cm             F. Tone:        Observed  F. Movement:    Observed                   Score:          8/8  F. Breathing:   Observed ---------------------------------------------------------------------- Biometry  BPD:      93.4  mm     G. Age:  38w 0d         98  %    CI:        75.64   %    70 - 86                                                          FL/HC:      20.3   %    20.1 - 22.3  HC:      340.5  mm     G. Age:  39w 1d         95  %    HC/AC:      0.92        0.93 - 1.11  AC:      369.1  mm     G. Age:  40w 6d       > 99  %    FL/BPD:     74.1   %    71 - 87  FL:       69.2  mm     G. Age:  35w 4d         50  %    FL/AC:      18.7   %    20 - 24  HUM:      57.4  mm     G. Age:  33w 2d         27  %  LV:        5.2  mm  Est. FW:    3724  gm      8 lb 3 oz   > 99  % ---------------------------------------------------------------------- OB History  Gravidity:    4         Term:   3        Prem:   0        SAB:   0  TOP:          0       Ectopic:  0        Living: 3 ---------------------------------------------------------------------- Gestational Age  LMP:           34w 6d        Date:  07/04/22                 EDD:   04/10/23  U/S Today:     38w 3d                                        EDD:   03/16/23  Best:          35w 2d     Det. ByMarcella Dubs         EDD:   04/07/23                                      (09/13/22) ---------------------------------------------------------------------- Anatomy  Cranium:               Appears normal         LVOT:                   Previously seen  Cavum:                 Previously seen        Aortic Arch:            Previously seen  Ventricles:            Previously seen         Ductal Arch:            Previously seen  Choroid Plexus:        Previously seen        Diaphragm:              Appears normal  Cerebellum:            Previously seen        Stomach:                Appears normal, left                                                                        sided  Posterior Fossa:       Previously seen        Abdomen:                Appears normal  Nuchal Fold:           Previously seen        Abdominal Wall:         Previously seen  Face:                  Orbits and profile     Cord Vessels:           Appears normal (3  previously seen                                vessel cord)  Lips:                  Previously seen        Kidneys:                Appear normal  Palate:                Not well visualized    Bladder:                Appears normal  Thoracic:              Appears normal         Spine:                  Previously seen  Heart:                 Previously seen        Upper Extremities:      Previously seen  RVOT:                  Previously seen        Lower Extremities:      Previously seen  Other:  SVC/IVC, 3VV, 3VTV previously visualized. Hands and feet          previously visualized. Heels previously seen. Nasal bone, lenses,          maxilla, mandible and falx previously visualized. ---------------------------------------------------------------------- Cervix Uterus Adnexa  Right Ovary  Not visualized.  Left Ovary  Not visualized. ---------------------------------------------------------------------- Impression  Cholestasis in pregnancy.  The estimated fetal weight and the abdominal circumference  measurements are at the 99th percentiles..Amniotic fluid is  normal and good fetal activity is seen .Antenatal testing is  reassuring. BPP 8/8.  Cephalic presentation.  Ultrasound has limitations in accurately estimating fetal  weights.  She does not have gestational diabetes.  We reassured the patient of the findings.  ---------------------------------------------------------------------- Recommendations  -NST at your office next week.  -IOL on 03/17/23 (scheduled by her provider). ----------------------------------------------------------------------                      Noralee Space, MD Electronically Signed Corrected Final Report  03/05/2023 05:20 pm ----------------------------------------------------------------------  [place] MFM FETAL BPP W/NONSTRESS  Result Date: 02/26/2023 ----------------------------------------------------------------------  OBSTETRICS REPORT                       (Signed Final 02/26/2023 05:20 pm) ---------------------------------------------------------------------- Patient Info  ID #:       [id number]                          D.O.B.:  Jan 09, 1991 (32 yrs)  Name:       Ebbie Ridge                 Visit Date: 02/26/2023 03:44 pm ---------------------------------------------------------------------- Performed By  Attending:        Braxton Feathers DO       Ref. Address:     [facility]  Obstetrics &                                                             Gynecology                                                             [address]                                                             [address]                                                             [place], [place]                                                             [address]  Performed By:     Eden Lathe BS      Location:         [facility] RVT                                 [facility]                                                             [facility]  Referred By:      Nigel Bridgeman  CNM ---------------------------------------------------------------------- Orders  #   Description                           Code        Ordered By  1  [place] MFM FETAL BPP                      [address].6     RAVI [facility]     W/NONSTRESS ----------------------------------------------------------------------  #  Order #                     Accession #                Episode #  1  [id number]                   [id number]                 [id number] ---------------------------------------------------------------------- Indications  [redacted] weeks gestation of pregnancy                Z3A.34  Obesity complicating pregnancy, third          O99.213  trimester (BMI 34)  Cholestasis of pregnancy, third trimester      O96.295M84.1  Encounter for other antenatal screening        Z36.2  follow-up  Poor obstetric history: Previous gestational   O09.299  diabetes ---------------------------------------------------------------------- Fetal Evaluation  Num Of Fetuses:         1  Fetal Heart Rate(bpm):  143  Cardiac Activity:       Observed  Presentation:           Cephalic  Placenta:               Posterior  P. Cord Insertion:      Previously visualized  Amniotic Fluid  AFI FV:      Within normal limits  AFI Sum(cm)     %Tile       Largest Pocket(cm)  10.24           21          3.96  RUQ(cm)                     LUQ(cm)        LLQ(cm)  3.96                        2.63           3.65 ---------------------------------------------------------------------- Biophysical Evaluation  Amniotic F.V:   Within normal limits       F. Tone:        Observed  F. Movement:    Observed                   N.S.T:          Reactive  F. Breathing:   Not Observed               Score:          8/10 ---------------------------------------------------------------------- OB History  Gravidity:    4         Term:   3        Prem:   0        SAB:   0  TOP:          0  Ectopic:  0        Living: 3 ---------------------------------------------------------------------- Gestational Age  LMP:           33w 6d        Date:  07/04/22                 EDD:   04/10/23   Best:          34w 2d     Det. ByMarcella Dubs         EDD:   04/07/23                                      (09/13/22) ---------------------------------------------------------------------- Anatomy  Stomach:               Appears normal, left   Bladder:                Appears normal                         sided  Kidneys:               Appear normal ---------------------------------------------------------------------- Cervix Uterus Adnexa  Cervix  Not visualized (advanced GA >24wks) ---------------------------------------------------------------------- Comments  The patient is here for a BPP for ICP. She is at 34w 2d. EDD  of 04/07/2023 dated by: Early Ultrasound  (09/13/22). She  has no concerns today.  Sonographic findings  Single intrauterine pregnancy.  Fetal cardiac activity: Observed.  Presentation: Cephalic.  Interval fetal anatomy appears normal.  Amniotic fluid volume: Within normal limits. AFI: 10.24 cm.  MVP: 3.96 cm.  Placenta: Posterior.  BPP: 8/10.  Recommendations  1. Continue weekly antenatal testing until delivery  2. Growth ultrasounds every 4 weeks until delivery  3. Delivery scheduled for 7/7  4. Fetal movement precautions given ----------------------------------------------------------------------                  Braxton Feathers, DO Electronically Signed Final Report   02/26/2023 05:20 pm ----------------------------------------------------------------------   MAU Course: Orders Placed This Encounter  Procedures   CBC   RPR   Diet clear liquid Room service appropriate? Yes; Fluid consistency: Thin   Vitals signs per unit policy   Notify physician (specify)   Fetal monitoring per unit policy   Activity as tolerated   Cervical Exam   Measure blood pressure post delivery every 15 min x 1 hour then every 30 min x 1 hour   Fundal check post delivery every 15 min x 1 hour then every 30 min x 1 hour   Apply Labor & Delivery Care Plan   If Rapid HIV test positive or known HIV  positive: initiate AZT orders   May in and out cath x 2 for inability to void   Insert urethral catheter X 1 PRN If Coude Catheter is chosen, qualified resources by campus can be found in the clinical skills nursing procedure for Coude Catheter 1. If straight catheterized > 2 times or patient unable to void post epidural plac...   Refer to Sidebar Report Urinary (Foley) Catheter Indications   Refer to Sidebar Report Post Indwelling Urinary Catheter Removal and Intervention Guidelines   Discontinue foley prior to vaginal delivery   Initiate Oral Care Protocol   Initiate Carrier Fluid Protocol   SCDs   Informed Consent Details: Physician/Practitioner Attestation; Transcribe to consent form and obtain patient  signature   Full code   Type and screen   Insert and maintain IV Line   Place in observation (patient's expected length of stay will be less than 2 midnights)   Meds ordered this encounter  Medications   lactated ringers infusion   oxytocin (PITOCIN) IV BOLUS FROM BAG   oxytocin (PITOCIN) IV infusion 30 units in NS 500 mL - Premix   lactated ringers infusion 500-1,000 mL   acetaminophen (TYLENOL) tablet 650 mg   sodium phosphate (FLEET) 7-19 GM/118ML enema 1 enema   ondansetron (ZOFRAN) injection 4 mg   sodium citrate-citric acid (ORACIT) solution 30 mL   lidocaine (PF) (XYLOCAINE) 1 % injection 30 mL    Assessment/Plan: Wendy Moses is a 32 y.o. female, G4P3003, IUP at 64 weeks, presenting for IOL for Cholestasis (BPP weekly or NST x2 starting at 32 weeks; Growth [place] every 4 weeks. Rx for Ursodiol 300 mg BID, Rx for Hydroxyzine 25 mg prn; Delivery at 37 weeks, AST 44 and ALT 128 on 02/01/2023, Bile acids 15.4 on 5/16). Late entry into prenatal care (15 weeks). Suspected LGA. Anemia (hgb 10.2 on po Iron). Pt endorse + Fm. Denies vaginal leakage. Denies vaginal bleeding. Denies feeling cxt's.   FWB: Cat 1 Fetal Tracing.   Plan: Admit to Birthing Suite per consult with DR  Alyse Low Routine [facility] orders Pain med/epidural prn Pitocin 1x1 Anticipate labor progression   [facility], FNP-C, PMHNP-BC  [address]  [place], [place] [address]  Cell: [phone]  Office Phone: [phone] Fax: [phone] 03/17/2023  3:55 PM

## 2023-03-17 NOTE — Anesthesia Preprocedure Evaluation (Signed)
Anesthesia Evaluation  Patient identified by MRN, date of birth, ID band Patient awake    Reviewed: Allergy & Precautions, Patient's Chart, lab work & pertinent test results  Airway Mallampati: I  TM Distance: >3 FB Neck ROM: Full    Dental  (+) Teeth Intact, Dental Advisory Given   Pulmonary neg pulmonary ROS   Pulmonary exam normal breath sounds clear to auscultation       Cardiovascular negative cardio ROS Normal cardiovascular exam Rhythm:Regular Rate:Normal     Neuro/Psych   Anxiety     negative neurological ROS  negative psych ROS   GI/Hepatic Neg liver ROS,GERD  ,,  Endo/Other  diabetes  Obesity  Renal/GU negative Renal ROS  negative genitourinary   Musculoskeletal negative musculoskeletal ROS (+)    Abdominal  (+) + obese  Peds  Hematology negative hematology ROS (+) Blood dyscrasia, anemia   Anesthesia Other Findings Cholelithiasis   Reproductive/Obstetrics (+) Pregnancy                             Anesthesia Physical Anesthesia Plan  ASA: 3  Anesthesia Plan: Epidural   Post-op Pain Management: Minimal or no pain anticipated   Induction: Intravenous  PONV Risk Score and Plan: 2  Airway Management Planned: Natural Airway  Additional Equipment:   Intra-op Plan:   Post-operative Plan:   Informed Consent: I have reviewed the patients History and Physical, chart, labs and discussed the procedure including the risks, benefits and alternatives for the proposed anesthesia with the patient or authorized representative who has indicated his/her understanding and acceptance.       Plan Discussed with: Anesthesiologist and CRNA  Anesthesia Plan Comments:         Anesthesia Quick Evaluation

## 2023-03-17 NOTE — Anesthesia Procedure Notes (Signed)
Epidural Patient location during procedure: OB Start time: 03/17/2023 9:43 PM End time: 03/17/2023 9:50 PM  Staffing Anesthesiologist: Bethena Midget, MD Performed: other anesthesia staff   Preanesthetic Checklist Completed: patient identified, IV checked, site marked, risks and benefits discussed, surgical consent, monitors and equipment checked, pre-op evaluation and timeout performed  Epidural Patient position: sitting Prep: DuraPrep and site prepped and draped Patient monitoring: continuous pulse ox and blood pressure Approach: midline Location: L3-L4 Injection technique: LOR air  Needle:  Needle type: Tuohy  Needle gauge: 17 G Needle length: 9 cm and 9 Needle insertion depth: 5 cm Catheter type: closed end flexible Catheter size: 19 Gauge Catheter at skin depth: 11 cm Test dose: negative  Assessment Events: blood not aspirated, no cerebrospinal fluid, injection not painful, no injection resistance, no paresthesia and negative IV test

## 2023-03-17 NOTE — Plan of Care (Signed)

## 2023-03-17 NOTE — Progress Notes (Signed)
Labor Progress Note  Wendy Moses is a 32 y.o. female, G4P3003, IUP at 37 weeks, presenting for IOL for Cholestasis (BPP weekly or NST x2 starting at 32 weeks; Growth [place] every 4 weeks. Rx for Ursodiol 300 mg BID, Rx for Hydroxyzine 25 mg prn; Delivery at 37 weeks, AST 44 and ALT 128 on 02/01/2023, Bile acids 15.4 on 5/16). Late entry into prenatal care (15 weeks). Suspected LGA. Anemia (hgb 10.2 on po Iron).   Subjective: Pt stable in bed reviewed R/B/A of LGA and shoulder dystocia and offered PCS< pt declined for now will still like to trial vaginal birth, pt open to changing her mind if other symptoms arise, also discussed POC with R/B/A of arom and pitocin pt verbalized consent to start both , plans for epidural when in pain, tolerated AROM well, clear fluids.  Patient Active Problem List   Diagnosis Date Noted   Cholestasis 03/17/2023   Obesity affecting pregnancy 02/19/2023   Cholestasis during pregnancy 02/01/2023   History of gestational diabetes mellitus (GDM) in prior pregnancy, currently pregnant 12/20/2022   Late prenatal care affecting pregnancy 12/20/2022   Objective: BP (!) 144/83 (BP Location: Right Arm)   Pulse (!) 130   Temp 98.7 F (37.1 C) (Oral)   Resp 17   Ht 5\' 4"  (1.626 m)   Wt 101.6 kg   LMP 07/04/2022   SpO2 100%   BMI 38.45 kg/m  No intake/output data recorded. No intake/output data recorded. NST: FHR baseline 145 bpm, Variability: moderate, Accelerations:present, Decelerations:  Absent= Cat 1/Reactive CTX:  irregular, every 2-6 minutes Uterus gravid, soft non tender, moderate to palpate with contractions.  SVE:  Dilation: 4 Effacement (%): 50 Station: -2, -1 Exam by:: [facility] CNM Pitocin at 2 mUn/min AROM, clear moderate tolerated well.   Assessment:  Wendy Moses is a 32 y.o. female, G4P3003, IUP at 37 weeks, presenting for IOL for Cholestasis (BPP weekly or NST x2 starting at 32 weeks; Growth [place] every 4 weeks. Rx for Ursodiol 300 mg  BID, Rx for Hydroxyzine 25 mg prn; Delivery at 37 weeks, AST 44 and ALT 128 on 02/01/2023, Bile acids 15.4 on 5/16). Late entry into prenatal care (15 weeks). Suspected LGA. Anemia (hgb 10.2 on po Iron). Progressing in latent labor  Patient Active Problem List   Diagnosis Date Noted   Cholestasis 03/17/2023   Obesity affecting pregnancy 02/19/2023   Cholestasis during pregnancy 02/01/2023   History of gestational diabetes mellitus (GDM) in prior pregnancy, currently pregnant 12/20/2022   Late prenatal care affecting pregnancy 12/20/2022  . NICHD: Category 1  Membranes:  AROM clear fluids, @ 1726 on 7/7 , no s/s of infection  Induction:    Cytotec xN/A  Foley Bulb: N/A  Pitocin - 2  Pain management:               IV pain management: x PRN  Nitrous: PRN             Epidural placement: PRN  GBS Negative  ICP: admitted ast 27, alt 15  Anemia: starting hgb 9.0   Plan: Continue labor plan Continuous/intermittent monitoring Rest Ambulate Frequent position changes to facilitate fetal rotation and descent. Will reassess with cervical exam at 4 or earlier if necessary Continue pitocin per protocol 2x2 Anticipate labor progression and vaginal delivery.  EBT: 2211 7/7  Md Eboni aware of plan and verbalized agreement. Assumes care at [address]  [facility] CNM, FNP-C, PMHNP-BC  [address] [facility] [address],  [place] [address]  Cell: [phone]  Office Phone: [phone] Fax: [phone] 03/17/2023  6:10 PM

## 2023-03-18 ENCOUNTER — Encounter (HOSPITAL_COMMUNITY): Payer: Self-pay | Admitting: Obstetrics and Gynecology

## 2023-03-18 DIAGNOSIS — Z3490 Encounter for supervision of normal pregnancy, unspecified, unspecified trimester: Secondary | ICD-10-CM | POA: Insufficient documentation

## 2023-03-18 LAB — CBC
HCT: 26.9 % — ABNORMAL LOW (ref 36.0–46.0)
HCT: 26.1 % — ABNORMAL LOW (ref 36.0–46.0)
Hemoglobin: 8.3 g/dL — ABNORMAL LOW (ref 12.0–15.0)
Hemoglobin: 8.2 g/dL — ABNORMAL LOW (ref 12.0–15.0)
MCH: 23.2 pg — ABNORMAL LOW (ref 26.0–34.0)
MCH: 24.3 pg — ABNORMAL LOW (ref 26.0–34.0)
MCHC: 30.9 g/dL (ref 30.0–36.0)
MCHC: 31.4 g/dL (ref 30.0–36.0)
MCV: 75.1 fL — ABNORMAL LOW (ref 80.0–100.0)
MCV: 77.4 fL — ABNORMAL LOW (ref 80.0–100.0)
Platelets: 377 10*3/uL (ref 150–400)
Platelets: 337 10*3/uL (ref 150–400)
RBC: 3.58 MIL/uL — ABNORMAL LOW (ref 3.87–5.11)
RBC: 3.37 MIL/uL — ABNORMAL LOW (ref 3.87–5.11)
RDW: 15.9 % — ABNORMAL HIGH (ref 11.5–15.5)
RDW: 15.8 % — ABNORMAL HIGH (ref 11.5–15.5)
WBC: 15.4 10*3/uL — ABNORMAL HIGH (ref 4.0–10.5)
WBC: 14.6 10*3/uL — ABNORMAL HIGH (ref 4.0–10.5)
nRBC: 0.3 % — ABNORMAL HIGH (ref 0.0–0.2)
nRBC: 0.3 % — ABNORMAL HIGH (ref 0.0–0.2)

## 2023-03-18 LAB — RPR: RPR Ser Ql: NONREACTIVE

## 2023-03-18 MED ORDER — ACETAMINOPHEN 325 MG PO TABS
650.0000 mg | ORAL_TABLET | ORAL | Status: DC | PRN
  Administered 2023-03-18 – 2023-03-19 (×2): 650 mg via ORAL
  Filled 2023-03-18 (×2): qty 2

## 2023-03-18 MED ORDER — DIBUCAINE (PERIANAL) 1 % EX OINT: 1.0000 | TOPICAL_OINTMENT | CUTANEOUS | Status: DC | PRN

## 2023-03-18 MED ORDER — SODIUM CHLORIDE 0.9 % IV SOLN
3.0000 g | Freq: Four times a day (QID) | INTRAVENOUS | Status: AC
  Administered 2023-03-18 (×3): 3 g via INTRAVENOUS
  Filled 2023-03-18 (×3): qty 8

## 2023-03-18 MED ORDER — ZOLPIDEM TARTRATE 5 MG PO TABS: 5.0000 mg | ORAL_TABLET | Freq: Every evening | ORAL | Status: DC | PRN

## 2023-03-18 MED ORDER — SIMETHICONE 80 MG PO CHEW: 80.0000 mg | CHEWABLE_TABLET | ORAL | Status: DC | PRN

## 2023-03-18 MED ORDER — METHYLERGONOVINE MALEATE 0.2 MG/ML IJ SOLN
0.2000 mg | Freq: Once | INTRAMUSCULAR | Status: AC
  Administered 2023-03-18: 0.2 mg via INTRAMUSCULAR

## 2023-03-18 MED ORDER — OXYCODONE HCL 5 MG PO TABS: 5.0000 mg | ORAL_TABLET | ORAL | Status: DC | PRN

## 2023-03-18 MED ORDER — ONDANSETRON HCL 4 MG/2ML IJ SOLN: 4.0000 mg | INTRAMUSCULAR | Status: DC | PRN

## 2023-03-18 MED ORDER — OXYCODONE HCL 5 MG PO TABS: 10.0000 mg | ORAL_TABLET | ORAL | Status: DC | PRN

## 2023-03-18 MED ORDER — TRANEXAMIC ACID-NACL 1000-0.7 MG/100ML-% IV SOLN: 1000.0000 mg | Freq: Once | INTRAVENOUS | Status: AC

## 2023-03-18 MED ORDER — COCONUT OIL OIL: 1.0000 | TOPICAL_OIL | Status: DC | PRN

## 2023-03-18 MED ORDER — TETANUS-DIPHTH-ACELL PERTUSSIS 5-2.5-18.5 LF-MCG/0.5 IM SUSY: 0.5000 mL | PREFILLED_SYRINGE | Freq: Once | INTRAMUSCULAR | Status: DC

## 2023-03-18 MED ORDER — MISOPROSTOL 200 MCG PO TABS
1000.0000 ug | ORAL_TABLET | Freq: Once | ORAL | Status: AC
  Administered 2023-03-18: 1000 ug via RECTAL

## 2023-03-18 MED ORDER — WITCH HAZEL-GLYCERIN EX PADS: 1.0000 | MEDICATED_PAD | CUTANEOUS | Status: DC | PRN

## 2023-03-18 MED ORDER — ONDANSETRON HCL 4 MG PO TABS: 4.0000 mg | ORAL_TABLET | ORAL | Status: DC | PRN

## 2023-03-18 MED ORDER — LACTATED RINGERS IV BOLUS
500.0000 mL | Freq: Once | INTRAVENOUS | Status: AC
  Administered 2023-03-18: 500 mL via INTRAVENOUS

## 2023-03-18 MED ORDER — DIPHENHYDRAMINE HCL 25 MG PO CAPS: 25.0000 mg | ORAL_CAPSULE | Freq: Four times a day (QID) | ORAL | Status: DC | PRN

## 2023-03-18 MED ORDER — SODIUM CHLORIDE 0.9 % IV SOLN
3.0000 g | Freq: Once | INTRAVENOUS | Status: AC
  Administered 2023-03-18: 3 g via INTRAVENOUS
  Filled 2023-03-18: qty 8

## 2023-03-18 MED ORDER — SENNOSIDES-DOCUSATE SODIUM 8.6-50 MG PO TABS
2.0000 | ORAL_TABLET | Freq: Every day | ORAL | Status: DC
  Filled 2023-03-18: qty 2

## 2023-03-18 MED ORDER — METHYLERGONOVINE MALEATE 0.2 MG/ML IJ SOLN
INTRAMUSCULAR | Status: AC
  Filled 2023-03-18: qty 1

## 2023-03-18 MED ORDER — PRENATAL MULTIVITAMIN CH
1.0000 | ORAL_TABLET | Freq: Every day | ORAL | Status: DC
  Administered 2023-03-19: 1 via ORAL
  Filled 2023-03-18: qty 1

## 2023-03-18 MED ORDER — TRANEXAMIC ACID-NACL 1000-0.7 MG/100ML-% IV SOLN
INTRAVENOUS | Status: AC
  Administered 2023-03-18: 1000 mg via INTRAVENOUS
  Filled 2023-03-18: qty 100

## 2023-03-18 MED ORDER — IBUPROFEN 600 MG PO TABS
600.0000 mg | ORAL_TABLET | Freq: Four times a day (QID) | ORAL | Status: DC
  Administered 2023-03-18 – 2023-03-19 (×5): 600 mg via ORAL
  Filled 2023-03-18 (×5): qty 1

## 2023-03-18 MED ORDER — MISOPROSTOL 200 MCG PO TABS
ORAL_TABLET | ORAL | Status: AC
  Filled 2023-03-18: qty 5

## 2023-03-18 MED ORDER — LACTATED RINGERS IV SOLN: INTRAVENOUS | Status: DC

## 2023-03-18 MED ORDER — BENZOCAINE-MENTHOL 20-0.5 % EX AERO: 1.0000 | INHALATION_SPRAY | CUTANEOUS | Status: DC | PRN

## 2023-03-18 NOTE — Lactation Note (Signed)
This note was copied from a baby's chart. Lactation Consultation Note  Patient Name: Wendy Moses [id number] Date: 03/18/2023 Age:30 hours  LC in to see P4 mother due to breast and formula feeding on admission. Mother speaks English and states she is only formula feeding. She is also familiar with managing non-nursing engorgement.       Consult Status  complete    Omar Person 03/18/2023, 7:08 PM

## 2023-03-18 NOTE — Anesthesia Postprocedure Evaluation (Signed)
Anesthesia Post Note  Patient: Wendy Moses  Procedure(s) Performed: AN AD HOC LABOR EPIDURAL     Patient location during evaluation: Mother Baby Anesthesia Type: Epidural Level of consciousness: awake and alert Pain management: pain level controlled Vital Signs Assessment: post-procedure vital signs reviewed and stable Respiratory status: spontaneous breathing, nonlabored ventilation and respiratory function stable Cardiovascular status: stable Postop Assessment: no headache, no backache and epidural receding Anesthetic complications: no   No notable events documented.  Last Vitals:  Vitals:   03/18/23 0623 03/18/23 0632  BP:  137/63  Pulse:  85  Resp:    Temp: (!) 39.1 C (!) 39.1 C  SpO2:  97%    Last Pain:  Vitals:   03/18/23 0632  TempSrc: Oral  PainSc: 3    Pain Goal:                   Garrin Kirwan

## 2023-03-18 NOTE — Progress Notes (Signed)
FOB neither mom had red ID band on . I had to print off new bands . Safety Zone will be put in .

## 2023-03-19 LAB — CBC
HCT: 24.5 % — ABNORMAL LOW (ref 36.0–46.0)
Hemoglobin: 7.4 g/dL — ABNORMAL LOW (ref 12.0–15.0)
MCH: 23.1 pg — ABNORMAL LOW (ref 26.0–34.0)
MCHC: 30.2 g/dL (ref 30.0–36.0)
MCV: 76.3 fL — ABNORMAL LOW (ref 80.0–100.0)
Platelets: 318 10*3/uL (ref 150–400)
RBC: 3.21 MIL/uL — ABNORMAL LOW (ref 3.87–5.11)
RDW: 15.9 % — ABNORMAL HIGH (ref 11.5–15.5)
WBC: 9.6 10*3/uL (ref 4.0–10.5)
nRBC: 0.2 % (ref 0.0–0.2)

## 2023-03-19 MED ORDER — SODIUM CHLORIDE 0.9 % IV SOLN
500.0000 mg | Freq: Once | INTRAVENOUS | Status: AC
  Administered 2023-03-19: 500 mg via INTRAVENOUS
  Filled 2023-03-19: qty 25

## 2023-03-19 MED ORDER — IBUPROFEN 600 MG PO TABS: 600.0000 mg | ORAL_TABLET | Freq: Four times a day (QID) | ORAL | 0 refills | Status: DC

## 2023-03-19 NOTE — Discharge Summary (Signed)
Postpartum Discharge Summary    Patient Name: Wendy Moses DOB: 05-Dec-1990 MRN: [id number]  Date of admission: 03/17/2023 Delivery date:03/18/2023  Delivering provider: Reesa Chew D  Date of discharge: 03/19/2023  Admitting diagnosis: Cholestasis [K83.1] Encounter for induction of labor [Z34.90] Intrauterine pregnancy: [redacted]w[redacted]d     Secondary diagnosis:  Principal Problem:   Cholestasis Active Problems:   Anemia of pregnancy   Vaginal delivery   Postpartum hemorrhage    Discharge diagnosis: Term Pregnancy Delivered                                              Post partum procedures: None Augmentation: AROM and Pitocin Complications: Hemorrhage>1096mL, Intrapartum fever.   Hospital course: Induction of Labor With Vaginal Delivery   32 y.o. yo [id number] at [redacted]w[redacted]d was admitted to the hospital 03/17/2023 for induction of labor.  Indication for induction:  Cholestasis of pregnancy .  Patient had intrapartum fever and she received IV unasyn for 24 hours. Membrane Rupture Time/Date: 5:26 PM ,03/17/2023   Delivery Method:Vaginal, Spontaneous  Episiotomy: None  Lacerations:  None  Details of delivery can be found in separate delivery note.  Patient had a postpartum course complicated by postpartum hemorrhage and she received uterotonics and TXA with decrease in bleeding.  She has postpartum anemia which was not symptomatic and she received IV iron for this.  Patient is discharged home 03/19/23.  Newborn Data: Birth date:03/18/2023  Birth time:3:58 AM  Gender:Female  Living status:Living  Apgars:8 ,9  Weight:3880 g  Transfusion:Yes  Physical exam  Vitals:   03/18/23 1503 03/18/23 2033 03/19/23 0531 03/19/23 0545  BP: 112/75 123/77 120/84   Pulse: 80 86 78 62  Resp: 18 19 17    Temp: 98 F (36.7 C) 98.1 F (36.7 C)    TempSrc:  Oral Oral   SpO2: 99% 99% 100%   Weight:      Height:       General: alert, cooperative, and no distress Lochia: appropriate Uterine Fundus:  firm Incision: N/A DVT Evaluation: No evidence of DVT seen on physical exam. Calf/Ankle edema is present Labs: Lab Results  Component Value Date   WBC 9.6 03/19/2023   HGB 7.4 (L) 03/19/2023   HCT 24.5 (L) 03/19/2023   MCV 76.3 (L) 03/19/2023   PLT 318 03/19/2023      Latest Ref Rng & Units 03/17/2023    3:13 PM  CMP  Glucose 70 - 99 mg/dL 147   BUN 6 - 20 mg/dL 5   Creatinine 8.29 - 5.62 mg/dL 1.30   Sodium 865 - 784 mmol/L 135   Potassium 3.5 - 5.1 mmol/L 4.2   Chloride 98 - 111 mmol/L 106   CO2 22 - 32 mmol/L 17   Calcium 8.9 - 10.3 mg/dL 8.5   Total Protein 6.5 - 8.1 g/dL 5.6   Total Bilirubin 0.3 - 1.2 mg/dL 0.9   Alkaline Phos 38 - 126 U/L 190   AST 15 - 41 U/L 27   ALT 0 - 44 U/L 15       Latest Ref Rng & Units 03/19/2023    5:19 AM 03/18/2023    9:40 AM 03/18/2023    5:04 AM  CBC  WBC 4.0 - 10.5 K/uL 9.6  14.6  15.4   Hemoglobin 12.0 - 15.0 g/dL 7.4  8.2  8.3   Hematocrit 36.0 -  46.0 % 24.5  26.1  26.9   Platelets 150 - 400 K/uL 318  337  377     Edinburgh Score:    03/18/2023    7:47 AM  Edinburgh Postnatal Depression Scale Screening Tool  I have been able to laugh and see the funny side of things. 0  I have looked forward with enjoyment to things. 0  I have blamed myself unnecessarily when things went wrong. 0  I have been anxious or worried for no good reason. 0  I have felt scared or panicky for no good reason. 0  Things have been getting on top of me. 0  I have been so unhappy that I have had difficulty sleeping. 0  I have felt sad or miserable. 0  I have been so unhappy that I have been crying. 0  The thought of harming myself has occurred to me. 0  Edinburgh Postnatal Depression Scale Total 0     After visit meds:  Allergies as of 03/19/2023   No Known Allergies      Medication List     STOP taking these medications    ursodiol 300 MG capsule Commonly known as: ACTIGALL       TAKE these medications    Fusion 65-65-25-30 MG Caps Take  1 capsule by mouth daily.   ibuprofen 600 MG tablet Commonly known as: ADVIL Take 1 tablet (600 mg total) by mouth every 6 (six) hours.   prenatal multivitamin Tabs tablet Take 1 tablet by mouth daily.       Discharge home in stable condition Infant Feeding: Bottle Infant Disposition:home with mother Discharge instruction: per After Visit Summary and Postpartum booklet. Activity: Advance as tolerated. Pelvic rest for 6 weeks.  Diet: routine diet Future Appointments:No future appointments. Follow up Visit:  Follow-up Information     [facility], [facility]. Go on 04/29/2023.   Specialty: Obstetrics and Gynecology Why: 6 week postpartum check. Contact information: [address] [place] [address] [phone]                Delivery mode:  Vaginal, Spontaneous  Anticipated Birth Control:  Plans Interval BTL   03/19/2023 Prescilla Sours, MD

## 2023-03-19 NOTE — Discharge Instructions (Signed)
Jansen, 1. Do not do any heavy lifting, i.e nothing heavier than 30 lbs for the next 6 weeks.  2.  Do not use tampons or douche or take baths, do not have any sexual intercourse or anything inside the vagina for the next 6 weeks.  3. Take your pain medication as needed for pain, let us know if the pain is not well controlled despite pain medication use.  4. Take your iron tablets daily for anemia.  You may also take a stool softener e.g colace if you are constipated.    5.  If you get a fever while at home, do check your temperature and if it is equal to or greater than 100.4 please call the office.   6. Some vaginal bleeding is expected and normal after your delivery. Please let us know if if it excessive where you saturate 1 pad in less than 2 hours or so.  7. Please let us know if with depression or anxiety symptoms.    [facility] [phone].

## 2023-03-19 NOTE — Progress Notes (Signed)
CSW received a consult for anxiety and met MOB at bedside to complete a mental health assessment. CSW entered the room, introduced herself and acknowledged that guest were present. MOB gave CSW verbal permission to speak about anything while guest were present. CSW explained her role and the reason for the visit. MOB presented as calm, was agreeable to consult and remained engaged throughout encounter.  CSW inquired about MOB's mental health history. MOB reported experiencing anxiety during the beginning of her pregnancy. MOB denied any prior history of mental health. MOB reported coping skills that were helpful that included being outside and getting fresh air to help with her symptoms. MOB denied being prescribed medication and participating in therapy for support. MOB reported currently feeling "good" and bonded well with infant. CSW provided education regarding the baby blues period vs. perinatal mood disorders, discussed treatment and gave resources for mental health follow up if concerns arise.  CSW recommends self-evaluation during the postpartum time period using the New Mom Checklist from Postpartum Progress and encouraged MOB to contact a medical professional if symptoms are noted at any time. CSW assessed for safety with MOB SI and HI; MOB denied all. CSW did not assess for DV; FOB was present.  CSW asked MOB has she chosen a pediatrician for infant's follow up visits: MOB said [facility]. MOB reported having all essential items for infant including a carseat and bassinet for safe sleeping. CSW provided review of Sudden Infant Death Syndrome (SIDS) precautions.  CSW identifies no further need for intervention and no barriers to discharge at this time.   Enos Fling, Theresia Majors Clinical Social Worker [phone]

## 2023-04-13 ENCOUNTER — Telehealth (HOSPITAL_COMMUNITY): Payer: Self-pay | Admitting: *Deleted

## 2023-04-13 NOTE — Telephone Encounter (Signed)
04/13/2023  Name: Wendy Moses MRN: [id number] DOB: 22-Sep-1990  Reason for Call:  Transition of Care Hospital Discharge Call  Contact Status: Patient Contact Status: Message  Language assistant needed: Interpreter Mode: Telephonic Interpreter Interpreter Name: Marcelino Duster #[id number] Interpreter Phone Number - If applicable: Language Line [phone]        Follow-Up Questions:    Inocente Salles Postnatal Depression Scale:  In the Past 7 Days:    PHQ2-9 Depression Scale:     Discharge Follow-up:    Post-discharge interventions: NA  Signature Deforest Hoyles, RN, [phone]

## 2023-04-25 ENCOUNTER — Other Ambulatory Visit: Payer: Self-pay | Admitting: Obstetrics & Gynecology

## 2023-04-30 ENCOUNTER — Other Ambulatory Visit: Payer: Self-pay | Admitting: Obstetrics and Gynecology

## 2023-04-30 DIAGNOSIS — N6332 Unspecified lump in axillary tail of the left breast: Secondary | ICD-10-CM

## 2023-05-02 ENCOUNTER — Encounter (HOSPITAL_BASED_OUTPATIENT_CLINIC_OR_DEPARTMENT_OTHER): Payer: Self-pay | Admitting: Obstetrics & Gynecology

## 2023-05-02 ENCOUNTER — Other Ambulatory Visit: Payer: Self-pay

## 2023-05-02 NOTE — Progress Notes (Signed)
Spoke w/ via phone for pre-op interview---[facility] Lab needs dos---- none              Lab results------05/14/23 lab appt for cbc, type & screen COVID test -----patient states asymptomatic no test needed Arrive at -------1000 on Friday, 05/17/2023 NPO after MN NO Solid Food.  Clear liquids from MN until---0900 Med rec completed Medications to take morning of surgery -----none Diabetic medication -----n/a Patient instructed no nail polish to be worn day of surgery Patient instructed to bring photo id and insurance card day of surgery Patient aware to have Driver (ride ) / caregiver    for 24 hours after surgery - husband, Avelino Patient Special Instructions -----none Pre-Op special Instructions -----Requested second sign for orders from Dr. Andree Moro on 05/02/23 via Epic IB. Patient verbalized understanding of instructions that were given at this phone interview. Patient denies shortness of breath, chest pain, fever, cough at this phone interview.  No interpreter needed. Patient speaks fluent [place].

## 2023-05-02 NOTE — Progress Notes (Signed)
Requested second sign via Epic IB message on 05/02/23.

## 2023-05-14 ENCOUNTER — Encounter (HOSPITAL_COMMUNITY)
Admission: RE | Admit: 2023-05-14 | Discharge: 2023-05-14 | Disposition: A | Discharge status: Home / Self Care | Payer: Medicaid Other | Source: Ambulatory Visit | Attending: Obstetrics & Gynecology | Admitting: Obstetrics & Gynecology

## 2023-05-14 VITALS — BP 134/88 | HR 60 | Temp 97.6°F | Resp 18 | Ht 64.0 in | Wt 202.0 lb

## 2023-05-14 DIAGNOSIS — Z01812 Encounter for preprocedural laboratory examination: Secondary | ICD-10-CM | POA: Diagnosis present

## 2023-05-14 DIAGNOSIS — Z01818 Encounter for other preprocedural examination: Secondary | ICD-10-CM

## 2023-05-14 LAB — CBC
HCT: 37.9 % (ref 36.0–46.0)
Hemoglobin: 11.3 g/dL — ABNORMAL LOW (ref 12.0–15.0)
MCH: 23.3 pg — ABNORMAL LOW (ref 26.0–34.0)
MCHC: 29.8 g/dL — ABNORMAL LOW (ref 30.0–36.0)
MCV: 78.1 fL — ABNORMAL LOW (ref 80.0–100.0)
Platelets: 397 10*3/uL (ref 150–400)
RBC: 4.85 MIL/uL (ref 3.87–5.11)
RDW: 17.9 % — ABNORMAL HIGH (ref 11.5–15.5)
WBC: 7.8 10*3/uL (ref 4.0–10.5)
nRBC: 0 % (ref 0.0–0.2)

## 2023-05-15 ENCOUNTER — Encounter: Payer: Self-pay | Admitting: Obstetrics and Gynecology

## 2023-05-16 ENCOUNTER — Ambulatory Visit
Admission: RE | Admit: 2023-05-16 | Discharge: 2023-05-16 | Disposition: A | Discharge status: Home / Self Care | Payer: Medicaid Other | Source: Ambulatory Visit | Attending: Obstetrics and Gynecology | Admitting: Obstetrics and Gynecology

## 2023-05-16 ENCOUNTER — Other Ambulatory Visit: Payer: Self-pay | Admitting: Obstetrics and Gynecology

## 2023-05-16 DIAGNOSIS — N6320 Unspecified lump in the left breast, unspecified quadrant: Secondary | ICD-10-CM

## 2023-05-16 DIAGNOSIS — N6332 Unspecified lump in axillary tail of the left breast: Secondary | ICD-10-CM

## 2023-05-16 NOTE — H&P (Signed)
Wendy Moses is an 32 y.o. female Para 4 here for sterilization via robotic assisted laparoscopic bilateral salpingectomy.   Pertinent Gynecological History: Menses:  None recently as she was breastfeeding until 1 month ago. Bleeding: None currently Contraception: none DES exposure: unknown Blood transfusions: none Sexually transmitted diseases: no past history Previous GYN Procedures:  None   Last mammogram: abnormal: BIRADS 3  Date: 05/16/2023 Last pap: normal Date: 11/15/22 OB History: G4, P4004   Menstrual History:  Patient's last menstrual period was 07/04/2022.    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 She has never used smokeless tobacco. She reports that she does not drink alcohol and does not use drugs.  Allergies: No Known Allergies  Current Outpatient Medications  Medication Instructions   Fe Fum-Fe Poly-Vit C-Lactobac (FUSION) 65-65-25-30 MG CAPS 1 capsule, Oral, Daily   Prenatal Vit-Fe Fumarate-FA (PRENATAL MULTIVITAMIN) TABS tablet 1 tablet, Oral, Daily    Review of Systems  Constitutional: Denies fevers/chills Cardiovascular: Denies chest pain or palpitations Pulmonary: Denies coughing or wheezing Gastrointestinal: Denies nausea, vomiting or diarrhea Genitourinary: Denies pelvic pain, unusual vaginal bleeding, unusual vaginal  discharge, dysuria, urgency or frequency.  Musculoskeletal: Denies muscle or joint aches and pain.  Neurology: Denies abnormal sensations such as tingling or numbness.    Weight 92.1 kg, last menstrual period 07/04/2022, not currently breastfeeding. Physical Exam Blood pressure 123/77, pulse 63, temperature 97.8 F (36.6 C), temperature source Oral, resp. rate 17, height 5\' 4"  (1.626 m), weight 91.9 kg, last menstrual period 07/04/2022, SpO2 98%, not currently breastfeeding. BMI 34. Constitutional: She is oriented to person, place, and time. She appears well-developed and well-nourished.  HENT:  Head: Normocephalic and atraumatic.  Eyes: Conjunctivae and EOM are normal.  Neck: Normal range of motion. Neck supple.  Cardiovascular: Normal rate, regular rhythm, normal heart sounds and intact distal pulses.  Respiratory: Effort normal and breath sounds normal.  GI: Soft. She exhibits no mass. There is no tenderness.  Genitourinary: Vagina normal and uterus normal.  Musculoskeletal: Normal range of motion.  Neurological: She is alert and oriented to person, place, and time.  Skin: Skin is warm and dry.  Psychiatric: She has a normal mood and affect. Judgment normal.    CBC    Component Value Date/Time   WBC 7.8 05/14/2023 0840   RBC 4.85 05/14/2023 0840   HGB 11.3 (L) 05/14/2023 0840   HCT 37.9 05/14/2023 0840   PLT 397 05/14/2023 0840   MCV 78.1 (L) 05/14/2023 0840   MCH 23.3 (L) 05/14/2023 0840   MCHC 29.8 (L) 05/14/2023 0840   RDW 17.9 (H) 05/14/2023 0840    05/17/23: Urine pregnancy test: Negative  A POS   MM 3D DIAGNOSTIC MAMMOGRAM BILATERAL BREAST  Result Date:  05/16/2023 CLINICAL DATA:  Patient presents with a small palpable lump in the left axilla. EXAM: DIGITAL DIAGNOSTIC BILATERAL MAMMOGRAM WITH TOMOSYNTHESIS AND CAD; ULTRASOUND LEFT BREAST LIMITED TECHNIQUE: Bilateral digital diagnostic mammography and breast tomosynthesis was performed. The images were evaluated with  computer-aided detection. ; Targeted ultrasound examination of the left breast was performed. COMPARISON:  None, baseline study. ACR Breast Density Category c: The breasts are heterogeneously dense, which may obscure small masses. FINDINGS: In the medial retroareolar left breast, there is a small circumscribed mass measuring 7-8 mm in long axis. There are no other suspicious breast masses, no areas of significant asymmetry, no architectural distortion and no suspicious calcifications. No mammographic abnormality in the left axilla. On physical exam, there is a tiny palpable nodule within the skin of the left axilla. Targeted left breast and axilla ultrasound is performed, showing a small elongated hypo to anechoic lesion within the skin the left axilla measuring 6 x 1 x 4 mm, consistent with a small sebaceous or inclusion cyst. This corresponds to the palpable abnormality. In the 8 o'clock position of the left breast, 1 cm from the nipple, there is a superficial hypoechoic mass, circumscribed, measuring 8 x 4 x 6 mm, consistent in size, shape and location to the mass seen mammographically. IMPRESSION: 1. 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 Short-term follow-up recommended. 2. Small benign skin lesion, consistent with a sebaceous or inclusion cyst, in the left axilla corresponding to palpable lesion. RECOMMENDATION: 1. Left breast ultrasound in 6 months to reassess the 8 mm mass at 8 o'clock. I have discussed the findings and recommendations with the patient. If applicable, a reminder letter will be sent to the patient regarding the next appointment. BI-RADS CATEGORY  3: Probably benign. Electronically Signed   By: Amie Portland M.D.   On: 05/16/2023 10:52   [place] LIMITED ULTRASOUND INCLUDING AXILLA LEFT BREAST   Result Date: 05/16/2023 CLINICAL DATA:  Patient presents with a small palpable lump in the left axilla. EXAM: DIGITAL DIAGNOSTIC  BILATERAL MAMMOGRAM WITH TOMOSYNTHESIS AND CAD; ULTRASOUND LEFT BREAST LIMITED TECHNIQUE: Bilateral digital diagnostic mammography and breast tomosynthesis was performed. The images were evaluated with computer-aided detection. ; Targeted ultrasound examination of the left breast was performed. COMPARISON:  None, baseline study. ACR Breast Density Category c: The breasts are heterogeneously dense, which may obscure small masses. FINDINGS: In the medial retroareolar left breast, there is a small circumscribed mass measuring 7-8 mm in long axis. There are no other suspicious breast masses, no areas of significant asymmetry, no architectural distortion and no suspicious calcifications. No mammographic abnormality in the left axilla. On physical exam, there is a tiny palpable nodule within the skin of the left axilla. Targeted left breast and axilla ultrasound is performed, showing a small elongated hypo to anechoic lesion within the skin the left axilla measuring 6 x 1 x 4 mm, consistent with a small sebaceous or inclusion cyst. This corresponds to the palpable abnormality. In the 8 o'clock position of the left breast, 1 cm from the nipple, there is a superficial hypoechoic mass, circumscribed, measuring 8 x 4 x 6 mm, consistent in size, shape and location to the mass seen mammographically. IMPRESSION: 1. 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 Short-term follow-up recommended. 2. Small benign skin lesion, consistent with a sebaceous or inclusion cyst, in  the left axilla corresponding to palpable lesion. RECOMMENDATION: 1. Left breast ultrasound in 6 months to reassess the 8 mm mass at 8 o'clock. I have discussed the findings and recommendations with the patient. If applicable, a reminder letter will be sent to the patient regarding the next appointment. BI-RADS CATEGORY  3: Probably benign. Electronically Signed   By: Amie Portland M.D.   On:  05/16/2023 10:52    Assessment/Plan: 32 y/o Para 4 here for sterilization via robotic assisted laparoscopic bilateral salpingectomy, - Admit to [facility] as per orders.  - We discussed risks, benefits and alternatives of postpartum tubal ligation including but not limited to risks of bleeding, infection, damage to organs.  She also understood the risk of tubal regret but she states she is 100% sure she did not want any more children.  We discussed that complete removal of both tubes will ensure complete sterilization but as with every procedure it is not 100% guaranteed.  She understood there were other kinds of birth control such as pills, patches, IUDs, vaginal rings and depo provera which were temporary but she did not desire them.  She understood there was also an option of female sterilization but she did not desire that option either.  She did not desire partial salpingectomy or tubal fulguration or placement of tubal clips.  All her questions were answered and she was consented for the procedure.     Prescilla Sours, MD.  05/17/2023, 11:52 AM.

## 2023-05-17 ENCOUNTER — Ambulatory Visit (HOSPITAL_BASED_OUTPATIENT_CLINIC_OR_DEPARTMENT_OTHER): Payer: Medicaid Other | Admitting: Certified Registered"

## 2023-05-17 ENCOUNTER — Encounter (HOSPITAL_BASED_OUTPATIENT_CLINIC_OR_DEPARTMENT_OTHER): Payer: Self-pay | Admitting: Obstetrics & Gynecology

## 2023-05-17 ENCOUNTER — Other Ambulatory Visit: Payer: Self-pay

## 2023-05-17 ENCOUNTER — Encounter (HOSPITAL_BASED_OUTPATIENT_CLINIC_OR_DEPARTMENT_OTHER)
Admission: RE | Disposition: A | Discharge status: Home / Self Care | Payer: Self-pay | Source: Home / Self Care | Attending: Obstetrics & Gynecology

## 2023-05-17 ENCOUNTER — Ambulatory Visit (HOSPITAL_BASED_OUTPATIENT_CLINIC_OR_DEPARTMENT_OTHER)
Admission: RE | Admit: 2023-05-17 | Discharge: 2023-05-17 | Disposition: A | Discharge status: Home / Self Care | Payer: Medicaid Other | Attending: Obstetrics & Gynecology | Admitting: Obstetrics & Gynecology

## 2023-05-17 DIAGNOSIS — Z302 Encounter for sterilization: Secondary | ICD-10-CM | POA: Insufficient documentation

## 2023-05-17 DIAGNOSIS — Z01818 Encounter for other preprocedural examination: Secondary | ICD-10-CM

## 2023-05-17 HISTORY — PX: XI ROBOTIC ASSISTED SALPINGECTOMY: SHX6824

## 2023-05-17 LAB — CBC
HCT: 37.8 % (ref 36.0–46.0)
Hemoglobin: 11.5 g/dL — ABNORMAL LOW (ref 12.0–15.0)
MCH: 23.3 pg — ABNORMAL LOW (ref 26.0–34.0)
MCHC: 30.4 g/dL (ref 30.0–36.0)
MCV: 76.5 fL — ABNORMAL LOW (ref 80.0–100.0)
Platelets: 416 10*3/uL — ABNORMAL HIGH (ref 150–400)
RBC: 4.94 MIL/uL (ref 3.87–5.11)
RDW: 18.2 % — ABNORMAL HIGH (ref 11.5–15.5)
WBC: 6.7 10*3/uL (ref 4.0–10.5)
nRBC: 0 % (ref 0.0–0.2)

## 2023-05-17 LAB — TYPE AND SCREEN
ABO/RH(D): A POS
Antibody Screen: NEGATIVE

## 2023-05-17 LAB — POCT PREGNANCY, URINE: Preg Test, Ur: NEGATIVE

## 2023-05-17 SURGERY — SALPINGECTOMY, ROBOT-ASSISTED
Anesthesia: General | Site: Abdomen | Laterality: Bilateral

## 2023-05-17 MED ORDER — DEXAMETHASONE SODIUM PHOSPHATE 10 MG/ML IJ SOLN
INTRAMUSCULAR | Status: AC
  Filled 2023-05-17: qty 1

## 2023-05-17 MED ORDER — ONDANSETRON HCL 4 MG/2ML IJ SOLN: 4.0000 mg | Freq: Once | INTRAMUSCULAR | Status: DC | PRN

## 2023-05-17 MED ORDER — ROCURONIUM BROMIDE 10 MG/ML (PF) SYRINGE
PREFILLED_SYRINGE | INTRAVENOUS | Status: DC | PRN
  Administered 2023-05-17: 70 mg via INTRAVENOUS

## 2023-05-17 MED ORDER — OXYCODONE HCL 5 MG/5ML PO SOLN: 5.0000 mg | Freq: Once | ORAL | Status: AC | PRN

## 2023-05-17 MED ORDER — OXYCODONE HCL 5 MG PO TABS: 5.0000 mg | ORAL_TABLET | ORAL | 0 refills | Status: AC | PRN

## 2023-05-17 MED ORDER — POVIDONE-IODINE 10 % EX SWAB: 2.0000 | Freq: Once | CUTANEOUS | Status: DC

## 2023-05-17 MED ORDER — LIDOCAINE HCL (PF) 2 % IJ SOLN
INTRAMUSCULAR | Status: AC
  Filled 2023-05-17: qty 5

## 2023-05-17 MED ORDER — FENTANYL CITRATE (PF) 100 MCG/2ML IJ SOLN
INTRAMUSCULAR | Status: DC | PRN
  Administered 2023-05-17: 100 ug via INTRAVENOUS

## 2023-05-17 MED ORDER — FENTANYL CITRATE (PF) 100 MCG/2ML IJ SOLN
INTRAMUSCULAR | Status: AC
  Filled 2023-05-17: qty 2

## 2023-05-17 MED ORDER — KETOROLAC TROMETHAMINE 30 MG/ML IJ SOLN: 30.0000 mg | Freq: Once | INTRAMUSCULAR | Status: DC | PRN

## 2023-05-17 MED ORDER — ONDANSETRON HCL 4 MG/2ML IJ SOLN
INTRAMUSCULAR | Status: DC | PRN
  Administered 2023-05-17: 4 mg via INTRAVENOUS

## 2023-05-17 MED ORDER — MIDAZOLAM HCL 2 MG/2ML IJ SOLN
INTRAMUSCULAR | Status: AC
  Filled 2023-05-17: qty 2

## 2023-05-17 MED ORDER — ONDANSETRON HCL 4 MG/2ML IJ SOLN
INTRAMUSCULAR | Status: AC
  Filled 2023-05-17: qty 2

## 2023-05-17 MED ORDER — FENTANYL CITRATE (PF) 100 MCG/2ML IJ SOLN
25.0000 ug | INTRAMUSCULAR | Status: DC | PRN
  Administered 2023-05-17 (×2): 50 ug via INTRAVENOUS

## 2023-05-17 MED ORDER — OXYCODONE HCL 5 MG PO TABS
ORAL_TABLET | ORAL | Status: AC
  Filled 2023-05-17: qty 1

## 2023-05-17 MED ORDER — MIDAZOLAM HCL 5 MG/5ML IJ SOLN
INTRAMUSCULAR | Status: DC | PRN
  Administered 2023-05-17: 2 mg via INTRAVENOUS

## 2023-05-17 MED ORDER — BUPIVACAINE HCL (PF) 0.25 % IJ SOLN
INTRAMUSCULAR | Status: DC | PRN
  Administered 2023-05-17 (×2): 10 mL

## 2023-05-17 MED ORDER — DEXAMETHASONE SODIUM PHOSPHATE 10 MG/ML IJ SOLN
INTRAMUSCULAR | Status: DC | PRN
  Administered 2023-05-17: 10 mg via INTRAVENOUS

## 2023-05-17 MED ORDER — IBUPROFEN 600 MG PO TABS: 600.0000 mg | ORAL_TABLET | Freq: Four times a day (QID) | ORAL | 0 refills | Status: AC | PRN

## 2023-05-17 MED ORDER — LIDOCAINE 2% (20 MG/ML) 5 ML SYRINGE
INTRAMUSCULAR | Status: DC | PRN
  Administered 2023-05-17: 80 mg via INTRAVENOUS

## 2023-05-17 MED ORDER — MEPERIDINE HCL 25 MG/ML IJ SOLN: 6.2500 mg | INTRAMUSCULAR | Status: DC | PRN

## 2023-05-17 MED ORDER — ACETAMINOPHEN 325 MG PO TABS: 325.0000 mg | ORAL_TABLET | ORAL | Status: DC | PRN

## 2023-05-17 MED ORDER — ACETAMINOPHEN 160 MG/5ML PO SOLN: 325.0000 mg | ORAL | Status: DC | PRN

## 2023-05-17 MED ORDER — SILVER NITRATE-POT NITRATE 75-25 % EX MISC
CUTANEOUS | Status: DC | PRN
Start: 2023-05-17 — End: 2023-05-17
  Administered 2023-05-17: 6 via TOPICAL

## 2023-05-17 MED ORDER — OXYCODONE HCL 5 MG PO TABS
5.0000 mg | ORAL_TABLET | Freq: Once | ORAL | Status: AC | PRN
  Administered 2023-05-17: 5 mg via ORAL

## 2023-05-17 MED ORDER — PROPOFOL 10 MG/ML IV BOLUS
INTRAVENOUS | Status: DC | PRN
  Administered 2023-05-17: 160 mg via INTRAVENOUS

## 2023-05-17 MED ORDER — KETOROLAC TROMETHAMINE 30 MG/ML IJ SOLN
INTRAMUSCULAR | Status: DC | PRN
  Administered 2023-05-17: 30 mg via INTRAVENOUS

## 2023-05-17 MED ORDER — 0.9 % SODIUM CHLORIDE (POUR BTL) OPTIME
TOPICAL | Status: DC | PRN
Start: 2023-05-17 — End: 2023-05-17
  Administered 2023-05-17: 500 mL

## 2023-05-17 MED ORDER — LACTATED RINGERS IV SOLN
INTRAVENOUS | Status: DC
  Administered 2023-05-17: 1000 mL via INTRAVENOUS

## 2023-05-17 MED ORDER — SUGAMMADEX SODIUM 200 MG/2ML IV SOLN
INTRAVENOUS | Status: DC | PRN
  Administered 2023-05-17: 200 mg via INTRAVENOUS

## 2023-05-17 MED ORDER — KETOROLAC TROMETHAMINE 30 MG/ML IJ SOLN
INTRAMUSCULAR | Status: AC
  Filled 2023-05-17: qty 1

## 2023-05-17 SURGICAL SUPPLY — 48 items
ADH SKN CLS APL DERMABOND .7 (GAUZE/BANDAGES/DRESSINGS) ×1
CANNULA CAP OBTURATR AIRSEAL 8 (CAP) ×1 IMPLANT
CATH ROBINSON RED A/P 16FR (CATHETERS) ×1 IMPLANT
COVER BACK TABLE 60X90IN (DRAPES) ×1 IMPLANT
COVER TIP SHEARS 8 DVNC (MISCELLANEOUS) ×1 IMPLANT
DEFOGGER SCOPE WARMER CLEARIFY (MISCELLANEOUS) ×1 IMPLANT
DERMABOND ADVANCED .7 DNX12 (GAUZE/BANDAGES/DRESSINGS) ×1 IMPLANT
DRAPE ARM DVNC X/XI (DISPOSABLE) ×4 IMPLANT
DRAPE COLUMN DVNC XI (DISPOSABLE) ×1 IMPLANT
DRAPE SURG IRRIG POUCH 19X23 (DRAPES) ×1 IMPLANT
DRAPE UTILITY XL STRL (DRAPES) ×1 IMPLANT
DRSG TEGADERM 4X4.75 (GAUZE/BANDAGES/DRESSINGS) IMPLANT
DURAPREP 26ML APPLICATOR (WOUND CARE) ×1 IMPLANT
ELECT REM PT RETURN 9FT ADLT (ELECTROSURGICAL) ×1
ELECTRODE REM PT RTRN 9FT ADLT (ELECTROSURGICAL) ×1 IMPLANT
FORCEPS BPLR LNG DVNC XI (INSTRUMENTS) ×1 IMPLANT
GAUZE 4X4 16PLY ~~LOC~~+RFID DBL (SPONGE) IMPLANT
GLOVE BIOGEL PI IND STRL 7.0 (GLOVE) ×3 IMPLANT
GLOVE ECLIPSE 6.5 STRL STRAW (GLOVE) IMPLANT
GLOVE SURG SS PI 6.5 STRL IVOR (GLOVE) ×3 IMPLANT
GLOVE SURG SS PI 7.0 STRL IVOR (GLOVE) IMPLANT
GLOVE SURG SS PI 7.5 STRL IVOR (GLOVE) IMPLANT
GOWN SRG XL LVL 4 BRTHBL STRL (GOWNS) IMPLANT
GOWN STRL NON-REIN XL LVL4 (GOWNS) ×1
IRRIG SUCT STRYKERFLOW 2 WTIP (MISCELLANEOUS)
IRRIGATION SUCT STRKRFLW 2 WTP (MISCELLANEOUS) IMPLANT
KIT PINK PAD W/HEAD ARE REST (MISCELLANEOUS) ×1
KIT PINK PAD W/HEAD ARM REST (MISCELLANEOUS) ×1 IMPLANT
KIT TURNOVER CYSTO (KITS) ×1 IMPLANT
LEGGING LITHOTOMY PAIR STRL (DRAPES) ×1 IMPLANT
MANIFOLD NEPTUNE II (INSTRUMENTS) ×1 IMPLANT
NDL INSUFFLATION 14GA 120MM (NEEDLE) ×1 IMPLANT
NEEDLE INSUFFLATION 14GA 120MM (NEEDLE) ×1
NS IRRIG 500ML POUR BTL (IV SOLUTION) IMPLANT
OBTURATOR OPTICAL STND 8 DVNC (TROCAR) ×1
OBTURATOR OPTICALSTD 8 DVNC (TROCAR) ×1 IMPLANT
PACK ROBOT WH (CUSTOM PROCEDURE TRAY) ×1 IMPLANT
PACK ROBOTIC GOWN (GOWN DISPOSABLE) ×1 IMPLANT
PAD OB MATERNITY 4.3X12.25 (PERSONAL CARE ITEMS) ×1 IMPLANT
PAD PREP 24X48 CUFFED NSTRL (MISCELLANEOUS) ×1 IMPLANT
PROTECTOR NERVE ULNAR (MISCELLANEOUS) ×1 IMPLANT
SEAL UNIV 5-12 XI (MISCELLANEOUS) ×2 IMPLANT
SEALER VESSEL EXT DVNC XI (MISCELLANEOUS) IMPLANT
SET TUBE FILTERED XL AIRSEAL (SET/KITS/TRAYS/PACK) ×1 IMPLANT
SLEEVE SCD COMPRESS KNEE MED (STOCKING) ×1 IMPLANT
SPIKE FLUID TRANSFER (MISCELLANEOUS) ×1 IMPLANT
SUT MNCRL AB 4-0 PS2 18 (SUTURE) ×1 IMPLANT
TOWEL OR 17X24 6PK STRL BLUE (TOWEL DISPOSABLE) ×1 IMPLANT

## 2023-05-17 NOTE — Anesthesia Procedure Notes (Signed)
Procedure Name: Intubation Date/Time: 05/17/2023 12:05 PM  Performed by: Temesha Queener D, CRNAPre-anesthesia Checklist: Patient identified, Emergency Drugs available, Suction available and Patient being monitored Patient Re-evaluated:Patient Re-evaluated prior to induction Oxygen Delivery Method: Circle system utilized Preoxygenation: Pre-oxygenation with 100% oxygen Induction Type: IV induction Ventilation: Mask ventilation without difficulty Laryngoscope Size: Mac and 3 Grade View: Grade I Tube type: Oral Tube size: 7.0 mm Number of attempts: 1 Airway Equipment and Method: Stylet and Oral airway Placement Confirmation: ETT inserted through vocal cords under direct vision, positive ETCO2 and breath sounds checked- equal and bilateral Secured at: 21 cm Tube secured with: Tape Dental Injury: Teeth and Oropharynx as per pre-operative assessment

## 2023-05-17 NOTE — Discharge Instructions (Addendum)
Wendy Moses,  You may shower as usual, keeping water exposure over the abdomen to a minimal and pat the incisions dry after showering.      You may see a sticky substance over the incisions, this is glue used for the incisions and will fall off by itself.   3.  No tampons or douching or vaginal intercourse after the procedure until at least two weeks after the surgery and after having been seen at the office.     4. Expect some light to moderate vaginal bleeding for the next 2 to 7 days but do let me know if with heavy bleeding requiring you to change a pad every two hours or less.  You may see some black residue in the vaginal bleeding from agents used during the procedure and this should clear over time.     5. You may have some back pain because of the gas used during the procedure, this should improve with time as you ambulate regularly.   6. Please ambulate regularly at least 1 hour every day in addition to your activities of daily living.   7.  You may see bruising around and near the incisions, this  will usually improve and resolve over time.  Let me know however if it is expanding or worsening.   8.  Call me if with excessive pain not improving with medication use, excessive vaginal bleeding or fevers or chills or any other concerns.    9. Use pain medication of tylenol over the counter as well as ordered ibuprofen and oxycodone for pain as needed. Please note that Ibuprofen may increase your risk of stomach ulcers. Please ensure that you take ibuprofen after a meal and look out for symptoms of stomach ulcers such as uncontrolled pain in epigastric region. Stop the ibuprofen if with such symptoms.   10.  Do not do any heavy lifting for the next 6 weeks, I.e nothing more then 30 lbs.   11. You may return to work between 2 days to 2 weeks depending on how you feel after the surgery.     I wish you a quick recovery,  Dr. Sallye Ober Phone: [phone] extension [phone] if regular business hours.    If After regular hours: call  office number and press the extension to speak to provider on call.     Post Anesthesia Home Care Instructions  Activity: Get plenty of rest for the remainder of the day. A responsible individual must stay with you for 24 hours following the procedure.  For the next 24 hours, DO NOT: -Drive a car -Advertising copywriter -Drink alcoholic beverages -Take any medication unless instructed by your physician -Make any legal decisions or sign important papers.  Meals: Start with liquid foods such as gelatin or soup. Progress to regular foods as tolerated. Avoid greasy, spicy, heavy foods. If nausea and/or vomiting occur, drink only clear liquids until the nausea and/or vomiting subsides. Call your physician if vomiting continues.  Special Instructions/Symptoms: Your throat may feel dry or sore from the anesthesia or the breathing tube placed in your throat during surgery. If this causes discomfort, gargle with warm salt water. The discomfort should disappear within 24 hours.

## 2023-05-17 NOTE — Interval H&P Note (Signed)
History and Physical Interval Note:  05/17/2023 11:52 AM  Wendy Moses  has presented today for surgery, with the diagnosis of eencounter for sterilization.  The various methods of treatment have been discussed with the patient and family. After consideration of risks, benefits and other options for treatment, the patient has consented to  Procedure(s): XI ROBOTIC ASSISTED SALPINGECTOMY (Bilateral) as a surgical intervention.  The patient's history has been reviewed, patient examined, no change in status, stable for surgery.  I have reviewed the patient's chart and labs.  Questions were answered to the patient's satisfaction.     Prescilla Sours, MD.

## 2023-05-17 NOTE — Transfer of Care (Signed)
Immediate Anesthesia Transfer of Care Note  Patient: Wendy Moses  Procedure(s) Performed: XI ROBOTIC ASSISTED SALPINGECTOMY (Bilateral: Abdomen)  Patient Location: PACU  Anesthesia Type:General  Level of Consciousness: awake, alert , and oriented  Airway & Oxygen Therapy: Patient Spontanous Breathing and Patient connected to nasal cannula oxygen  Post-op Assessment: Report given to RN and Post -op Vital signs reviewed and stable  Post vital signs: Reviewed and stable  Last Vitals:  Vitals Value Taken Time  BP 123/81 05/17/23 1345  Temp 35.9 C 05/17/23 1342  Pulse 67 05/17/23 1345  Resp 17 05/17/23 1345  SpO2 100 % 05/17/23 1345  Vitals shown include unfiled device data.  Last Pain:  Vitals:   05/17/23 1028  TempSrc: Oral  PainSc: 0-No pain      Patients Stated Pain Goal: 5 (05/17/23 1028)  Complications: No notable events documented.

## 2023-05-17 NOTE — Anesthesia Preprocedure Evaluation (Signed)
Anesthesia Evaluation  Patient identified by MRN, date of birth, ID band Patient awake    Reviewed: Allergy & Precautions, NPO status , Patient's Chart, lab work & pertinent test results  Airway Mallampati: I       Dental  (+) Teeth Intact, Dental Advisory Given   Pulmonary neg pulmonary ROS   Pulmonary exam normal breath sounds clear to auscultation       Cardiovascular negative cardio ROS Normal cardiovascular exam Rhythm:Regular Rate:Normal     Neuro/Psych   Anxiety     negative neurological ROS  negative psych ROS   GI/Hepatic Neg liver ROS,,,  Endo/Other  Gestational  Obesity  Renal/GU negative Renal ROS  negative genitourinary   Musculoskeletal negative musculoskeletal ROS (+)    Abdominal  (+) + obese  Peds  Hematology negative hematology ROS (+)   Anesthesia Other Findings Cholelithiasis   Reproductive/Obstetrics                             Anesthesia Physical Anesthesia Plan  ASA: 2  Anesthesia Plan: General   Post-op Pain Management: Minimal or no pain anticipated   Induction: Intravenous  PONV Risk Score and Plan: 2 and Ondansetron, Dexamethasone and Midazolam  Airway Management Planned: Oral ETT  Additional Equipment: None  Intra-op Plan:   Post-operative Plan: Extubation in OR  Informed Consent: I have reviewed the patients History and Physical, chart, labs and discussed the procedure including the risks, benefits and alternatives for the proposed anesthesia with the patient or authorized representative who has indicated his/her understanding and acceptance.     Dental advisory given  Plan Discussed with: CRNA  Anesthesia Plan Comments:         Anesthesia Quick Evaluation

## 2023-05-17 NOTE — Op Note (Addendum)
Patient: Wendy Moses DOB: Apr 04, 1991 MRN: [id number] Date of procedure: 05/17/2023.   PREOP DIAGNOSIS:  1. Multiparous patient (Para 4) who desires permanent sterilization.   2. BMI of 34.7 with thick abdominal wall.     Post operative diagnosis:  Same as above.        Procedures:  Robotic assisted laparoscopic bilateral salpingectomy.   Surgeon: Dr. Hoover Browns.  Assistant: Herbert Seta, RNFA.    SURGEON ATTESTATION: I was present and scrubbed for the entire case.  An experienced assistant was required given the standard of surgical care given the complexity of the case.  This assistant was needed for exposure, retraction, instrument exchange and for overall help during the surgery.    Anesthesiologist: General anesthesia- ETA, Dr. Arby Barrette, [place].   Complications: None.  Urine: voided before procedure.    EBL: 10 cc.  Indications: 32 y/o female Para 4 who desired permanent sterilization via bilateral salpingectomy.    Procedure: Informed consent was obtained from the patient to undergo the procedure after discussing risks that include but not limited to risks of bleeding, infection and damage to organs.  She was taken to the operating room and anesthesia was administered.  She was carefully positioned in the operating room table in dorsal lithotomy position with both arms tucked to her sides and on the Pink Trendelenburg Pad.  An exam done showed a small anteverted uterus and a closed cervix.  There were no palpable adnexal masses.   She was prepped and draped in the usual sterile fashion. She was straight catheterized with small urine collected.   A Graves speculum was placed into vagina.  Tenaculum was placed at anterior cervix.  0.25% marcaine given as paracervical block, 10 cc total. The Acorn manipulator was placed into cervix.  Patient was placed in low dorsal lithotomy position.  Attention was then turned to the abdomen.  0.25% marcaine was injected in infraumbilical port and 3  mm incision made. The 120 mm veress needle was placed in and correct placement was confirmed with irrigation, suction and hanging drop test.   Gas was instilled with high opening pressures of 11 mm Hg noted.  2 more attempts with Veress needle was done with similar outcomes.  I suspect the Veress needle was not all the way into the cavity or obstructed  and hence the high opening pressures.  I then decided to proceed with Palmer's point entree. An orogastric tube was placed by anesthesia.  Marcaine was instilled at Palmer's point, about 2 cm below the costal margin along mid clavicular line.  Veress needle was placed in, correct placement was confirmed with suction, irrigator and hanging drop test.  Gas was connected and low opening pressures were noted.  More gas was instilled to a maximum pressure of 15 mm Hg.  The incision was extended for an 8 mm port.  The 8 mm robotic trocar was placed with direct visualization with the 0 degree scope.  Upon entry there was no notable injury to organs or vessels.   She was placed in T-burg position and maximum gas pressure was set to 15 mm Hg.  Two other side ports were placed on the left abdominal wall and infraumbilical area under direct visualization for robot arms 1 and 3 in a triangulated fashion.  Arm 1 was placed about 10 cm lateral and inferior to the palmer's point trocar.  The 30 degree robot camera was docked onto arm 2, targeting performed then the other ports were docked: Bipolar grasper was placed  in Arm 1, vessel sealer in arm 3.  The abdomen and pelvis were surveyed with the above findings.  Pneumoperitoneum pressure was dropped to 10 mm Hg.  From the fimbria to the cornual end the left fallopian tube was sealed then excised with the vessel sealer and placed in anterior cul de sac.  The right fallopian tube was similarly sealed and excised.  The specimens were delivered under direct visualization through arm 3 by the assistant using a laparoscopic grasper after  removal of the vessel sealer.  Good hemostasis was noted on the remaining pedicles.  The long bipolar grasper was then removed with direct visualization.  The [employer] was undocked.  Gas was turned off and was allowed to escape from the cavity, then the ports were removed under visualization with the 30 degree robot camera.  Patient received five manual inflations from the anesthesia.  She was then taken out of Trendelenburg.  The skin incisions were closed with 4-0 monocryl.  Dermabond was then applied over the skin incisions.  Attention was then turned to perineum.  Speculum was placed in and Acorn manipulator and tenaculum removed.  Hemostasis on the cervix was achieved with silver nitrate. Patient was then cleaned and taken to the recovery room in stable condition.  Specimens: Left and right fallopian tubes.  Dr. Hoover Browns. 05/17/23.  AM.

## 2023-05-20 ENCOUNTER — Encounter (HOSPITAL_BASED_OUTPATIENT_CLINIC_OR_DEPARTMENT_OTHER): Payer: Self-pay | Admitting: Obstetrics & Gynecology

## 2023-05-20 LAB — SURGICAL PATHOLOGY

## 2023-06-25 NOTE — Anesthesia Postprocedure Evaluation (Signed)
Anesthesia Post Note  Patient: Wendy Moses  Procedure(s) Performed: XI ROBOTIC ASSISTED SALPINGECTOMY (Bilateral: Abdomen)     Anesthesia Type: General Level of consciousness: awake Pain management: pain level controlled Vital Signs Assessment: post-procedure vital signs reviewed and stable Respiratory status: spontaneous breathing Cardiovascular status: stable Postop Assessment: no apparent nausea or vomiting Anesthetic complications: no  No notable events documented.  Last Vitals:  Vitals:   05/17/23 1500 05/17/23 1530  BP: 118/71 117/71  Pulse: 61 65  Resp: 11 14  Temp: (!) 36.2 C (!) 36.2 C  SpO2: 98% 99%    Last Pain:  Vitals:   05/17/23 1530  TempSrc:   PainSc: 4                  John F Scharlene Corn

## 2023-11-14 ENCOUNTER — Ambulatory Visit
Admission: RE | Admit: 2023-11-14 | Discharge: 2023-11-14 | Disposition: A | Discharge status: Home / Self Care | Payer: Medicaid Other | Source: Ambulatory Visit | Attending: Obstetrics and Gynecology | Admitting: Obstetrics and Gynecology

## 2023-11-14 ENCOUNTER — Other Ambulatory Visit: Payer: Self-pay | Admitting: Obstetrics and Gynecology

## 2023-11-14 DIAGNOSIS — N6320 Unspecified lump in the left breast, unspecified quadrant: Secondary | ICD-10-CM

## 2024-04-07 ENCOUNTER — Other Ambulatory Visit

## 2024-06-10 ENCOUNTER — Other Ambulatory Visit
